# Patient Record
Sex: Female | Born: 1970 | Race: Black or African American | Hispanic: No | Marital: Married | Smoking: Never smoker
Health system: Southern US, Community
[De-identification: ages and names within clinical notes are randomized; demographics above are authoritative.]

## PROBLEM LIST (undated history)

## (undated) DIAGNOSIS — H3500 Unspecified background retinopathy: Secondary | ICD-10-CM

## (undated) DIAGNOSIS — N289 Disorder of kidney and ureter, unspecified: Secondary | ICD-10-CM

## (undated) DIAGNOSIS — F419 Anxiety disorder, unspecified: Secondary | ICD-10-CM

## (undated) DIAGNOSIS — I10 Essential (primary) hypertension: Secondary | ICD-10-CM

## (undated) DIAGNOSIS — G459 Transient cerebral ischemic attack, unspecified: Secondary | ICD-10-CM

## (undated) DIAGNOSIS — I469 Cardiac arrest, cause unspecified: Secondary | ICD-10-CM

## (undated) DIAGNOSIS — E119 Type 2 diabetes mellitus without complications: Secondary | ICD-10-CM

## (undated) HISTORY — PX: OTHER SURGICAL HISTORY: SHX169

## (undated) HISTORY — PX: AV FISTULA INSERTION W/ RF MAGNETIC GUIDANCE: CATH118308

## (undated) HISTORY — PX: ABDOMINAL HYSTERECTOMY: SHX81

---

## 1997-10-04 ENCOUNTER — Ambulatory Visit (HOSPITAL_COMMUNITY): Admission: RE | Admit: 1997-10-04 | Discharge: 1997-10-04 | Payer: Self-pay | Admitting: Specialist

## 1997-10-14 ENCOUNTER — Ambulatory Visit (HOSPITAL_BASED_OUTPATIENT_CLINIC_OR_DEPARTMENT_OTHER): Admission: RE | Admit: 1997-10-14 | Discharge: 1997-10-14 | Payer: Self-pay | Admitting: Specialist

## 1997-11-11 ENCOUNTER — Ambulatory Visit (HOSPITAL_COMMUNITY): Admission: RE | Admit: 1997-11-11 | Discharge: 1997-11-11 | Payer: Self-pay | Admitting: Specialist

## 1998-01-23 ENCOUNTER — Emergency Department (HOSPITAL_COMMUNITY): Admission: EM | Admit: 1998-01-23 | Discharge: 1998-01-23 | Payer: Self-pay | Admitting: Emergency Medicine

## 1998-01-27 ENCOUNTER — Other Ambulatory Visit: Admission: RE | Admit: 1998-01-27 | Discharge: 1998-01-27 | Payer: Self-pay | Admitting: Obstetrics and Gynecology

## 1998-02-27 ENCOUNTER — Inpatient Hospital Stay (HOSPITAL_COMMUNITY): Admission: AD | Admit: 1998-02-27 | Discharge: 1998-02-27 | Payer: Self-pay | Admitting: Obstetrics & Gynecology

## 1998-04-06 ENCOUNTER — Inpatient Hospital Stay (HOSPITAL_COMMUNITY): Admission: AD | Admit: 1998-04-06 | Discharge: 1998-04-06 | Payer: Self-pay | Admitting: Obstetrics and Gynecology

## 1998-05-05 ENCOUNTER — Inpatient Hospital Stay (HOSPITAL_COMMUNITY): Admission: AD | Admit: 1998-05-05 | Discharge: 1998-05-05 | Payer: Self-pay | Admitting: Obstetrics and Gynecology

## 1998-05-05 ENCOUNTER — Encounter: Payer: Self-pay | Admitting: Obstetrics and Gynecology

## 1998-05-07 ENCOUNTER — Encounter: Admission: RE | Admit: 1998-05-07 | Discharge: 1998-08-05 | Payer: Self-pay | Admitting: Obstetrics and Gynecology

## 1998-05-07 ENCOUNTER — Inpatient Hospital Stay (HOSPITAL_COMMUNITY): Admission: AD | Admit: 1998-05-07 | Discharge: 1998-05-16 | Payer: Self-pay | Admitting: Obstetrics and Gynecology

## 1998-06-21 ENCOUNTER — Observation Stay (HOSPITAL_COMMUNITY): Admission: AD | Admit: 1998-06-21 | Discharge: 1998-06-22 | Payer: Self-pay | Admitting: Obstetrics and Gynecology

## 1998-06-23 ENCOUNTER — Inpatient Hospital Stay (HOSPITAL_COMMUNITY): Admission: AD | Admit: 1998-06-23 | Discharge: 1998-06-23 | Payer: Self-pay | Admitting: Obstetrics and Gynecology

## 1998-07-21 ENCOUNTER — Ambulatory Visit (HOSPITAL_COMMUNITY): Admission: RE | Admit: 1998-07-21 | Discharge: 1998-07-21 | Payer: Self-pay | Admitting: Obstetrics and Gynecology

## 1998-07-26 ENCOUNTER — Inpatient Hospital Stay (HOSPITAL_COMMUNITY): Admission: AD | Admit: 1998-07-26 | Discharge: 1998-07-28 | Payer: Self-pay | Admitting: Obstetrics and Gynecology

## 1998-08-28 ENCOUNTER — Other Ambulatory Visit: Admission: RE | Admit: 1998-08-28 | Discharge: 1998-08-28 | Payer: Self-pay | Admitting: Obstetrics and Gynecology

## 1999-06-18 ENCOUNTER — Ambulatory Visit (HOSPITAL_COMMUNITY): Admission: RE | Admit: 1999-06-18 | Discharge: 1999-06-18 | Payer: Self-pay | Admitting: Obstetrics and Gynecology

## 1999-06-18 ENCOUNTER — Encounter (INDEPENDENT_AMBULATORY_CARE_PROVIDER_SITE_OTHER): Payer: Self-pay | Admitting: Specialist

## 1999-07-17 ENCOUNTER — Emergency Department (HOSPITAL_COMMUNITY): Admission: EM | Admit: 1999-07-17 | Discharge: 1999-07-17 | Payer: Self-pay | Admitting: Emergency Medicine

## 1999-07-17 ENCOUNTER — Encounter: Payer: Self-pay | Admitting: Emergency Medicine

## 1999-08-20 ENCOUNTER — Encounter: Payer: Self-pay | Admitting: Emergency Medicine

## 1999-08-20 ENCOUNTER — Emergency Department (HOSPITAL_COMMUNITY): Admission: EM | Admit: 1999-08-20 | Discharge: 1999-08-20 | Payer: Self-pay | Admitting: Emergency Medicine

## 1999-09-29 ENCOUNTER — Other Ambulatory Visit: Admission: RE | Admit: 1999-09-29 | Discharge: 1999-09-29 | Payer: Self-pay | Admitting: Obstetrics and Gynecology

## 2000-01-12 ENCOUNTER — Emergency Department (HOSPITAL_COMMUNITY): Admission: EM | Admit: 2000-01-12 | Discharge: 2000-01-12 | Payer: Self-pay | Admitting: Emergency Medicine

## 2000-08-03 ENCOUNTER — Encounter: Admission: RE | Admit: 2000-08-03 | Discharge: 2000-11-01 | Payer: Self-pay | Admitting: Gynecology

## 2000-12-23 ENCOUNTER — Inpatient Hospital Stay (HOSPITAL_COMMUNITY): Admission: AD | Admit: 2000-12-23 | Discharge: 2000-12-23 | Payer: Self-pay | Admitting: *Deleted

## 2001-01-11 ENCOUNTER — Observation Stay (HOSPITAL_COMMUNITY): Admission: AD | Admit: 2001-01-11 | Discharge: 2001-01-12 | Payer: Self-pay | Admitting: Gynecology

## 2001-01-20 ENCOUNTER — Inpatient Hospital Stay (HOSPITAL_COMMUNITY): Admission: AD | Admit: 2001-01-20 | Discharge: 2001-01-22 | Payer: Self-pay | Admitting: Gynecology

## 2001-01-21 ENCOUNTER — Encounter: Payer: Self-pay | Admitting: Gynecology

## 2001-02-08 ENCOUNTER — Inpatient Hospital Stay (HOSPITAL_COMMUNITY): Admission: AD | Admit: 2001-02-08 | Discharge: 2001-02-13 | Payer: Self-pay | Admitting: Gynecology

## 2001-02-08 ENCOUNTER — Encounter: Payer: Self-pay | Admitting: *Deleted

## 2001-02-08 ENCOUNTER — Inpatient Hospital Stay (HOSPITAL_COMMUNITY): Admission: RE | Admit: 2001-02-08 | Discharge: 2001-02-08 | Payer: Self-pay | Admitting: *Deleted

## 2001-02-08 ENCOUNTER — Encounter (INDEPENDENT_AMBULATORY_CARE_PROVIDER_SITE_OTHER): Payer: Self-pay | Admitting: Specialist

## 2001-02-10 ENCOUNTER — Encounter: Payer: Self-pay | Admitting: Obstetrics and Gynecology

## 2001-03-13 ENCOUNTER — Other Ambulatory Visit: Admission: RE | Admit: 2001-03-13 | Discharge: 2001-03-13 | Payer: Self-pay | Admitting: Gynecology

## 2002-12-30 ENCOUNTER — Encounter: Payer: Self-pay | Admitting: Emergency Medicine

## 2002-12-30 ENCOUNTER — Emergency Department (HOSPITAL_COMMUNITY): Admission: EM | Admit: 2002-12-30 | Discharge: 2002-12-30 | Payer: Self-pay | Admitting: Emergency Medicine

## 2003-02-19 ENCOUNTER — Emergency Department (HOSPITAL_COMMUNITY): Admission: EM | Admit: 2003-02-19 | Discharge: 2003-02-19 | Payer: Self-pay | Admitting: Emergency Medicine

## 2003-08-08 ENCOUNTER — Emergency Department (HOSPITAL_COMMUNITY): Admission: EM | Admit: 2003-08-08 | Discharge: 2003-08-08 | Payer: Self-pay | Admitting: Emergency Medicine

## 2003-08-16 ENCOUNTER — Emergency Department (HOSPITAL_COMMUNITY): Admission: EM | Admit: 2003-08-16 | Discharge: 2003-08-16 | Payer: Self-pay | Admitting: Emergency Medicine

## 2003-09-26 ENCOUNTER — Encounter: Admission: RE | Admit: 2003-09-26 | Discharge: 2003-09-26 | Payer: Self-pay | Admitting: Internal Medicine

## 2003-10-14 ENCOUNTER — Other Ambulatory Visit: Admission: RE | Admit: 2003-10-14 | Discharge: 2003-10-14 | Payer: Self-pay | Admitting: Obstetrics and Gynecology

## 2003-10-21 ENCOUNTER — Emergency Department (HOSPITAL_COMMUNITY): Admission: EM | Admit: 2003-10-21 | Discharge: 2003-10-21 | Payer: Self-pay | Admitting: Emergency Medicine

## 2003-11-18 ENCOUNTER — Encounter: Admission: RE | Admit: 2003-11-18 | Discharge: 2004-02-16 | Payer: Self-pay | Admitting: Endocrinology

## 2004-01-28 ENCOUNTER — Encounter (INDEPENDENT_AMBULATORY_CARE_PROVIDER_SITE_OTHER): Payer: Self-pay | Admitting: *Deleted

## 2004-01-28 ENCOUNTER — Inpatient Hospital Stay (HOSPITAL_COMMUNITY): Admission: RE | Admit: 2004-01-28 | Discharge: 2004-01-31 | Payer: Self-pay | Admitting: Obstetrics and Gynecology

## 2004-03-09 ENCOUNTER — Encounter: Admission: RE | Admit: 2004-03-09 | Discharge: 2004-03-09 | Payer: Self-pay | Admitting: Obstetrics and Gynecology

## 2004-05-06 ENCOUNTER — Emergency Department (HOSPITAL_COMMUNITY): Admission: EM | Admit: 2004-05-06 | Discharge: 2004-05-06 | Payer: Self-pay | Admitting: Emergency Medicine

## 2005-02-06 ENCOUNTER — Inpatient Hospital Stay (HOSPITAL_COMMUNITY): Admission: EM | Admit: 2005-02-06 | Discharge: 2005-02-08 | Payer: Self-pay | Admitting: Emergency Medicine

## 2005-03-18 ENCOUNTER — Ambulatory Visit: Payer: Self-pay | Admitting: Endocrinology

## 2005-03-25 ENCOUNTER — Ambulatory Visit: Payer: Self-pay | Admitting: Endocrinology

## 2005-04-01 ENCOUNTER — Ambulatory Visit: Payer: Self-pay | Admitting: Endocrinology

## 2005-04-16 ENCOUNTER — Ambulatory Visit: Payer: Self-pay | Admitting: Endocrinology

## 2005-05-07 ENCOUNTER — Ambulatory Visit: Payer: Self-pay | Admitting: Endocrinology

## 2005-05-15 IMAGING — CT CT ANGIO CHEST
1 of 3 series · 20 of 32 positions shown · IV contrast (omnipaque)
Comparison: None.

CLINICAL DATA: Chest pain, short of breath. 
CT ANGIOGRAPHY OF THE CHEST
TECHNIQUE: 150 cc of Omnipaque 300 IV.   PE protocol performed with multiplanar reconstruction.

[Series 4: chest/pe 1.0 b10f · axial · 0.74mm/px · z∈[-245,-35]mm · 20 of 464 slices shown]
[im 22/464  lung]
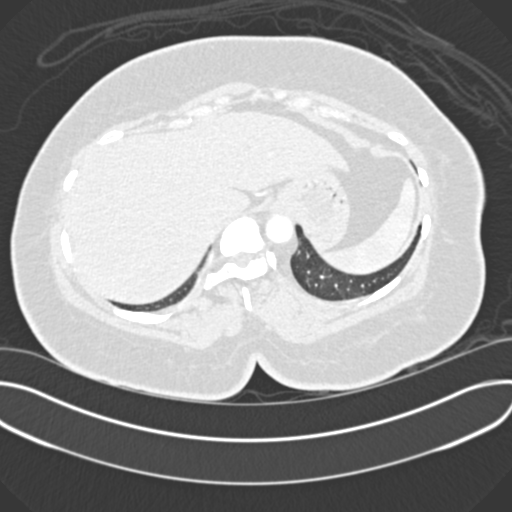
[im 43/464  mediastinal]
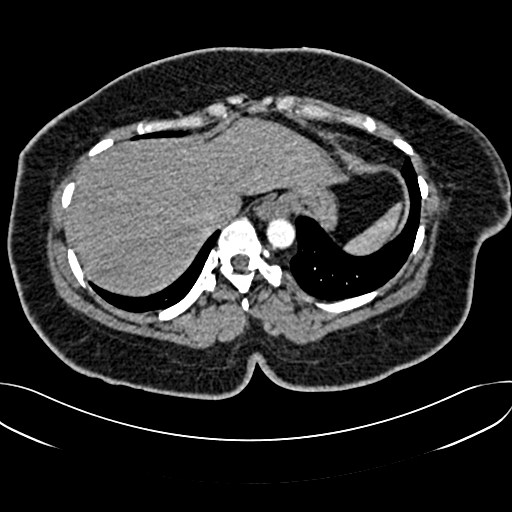
[im 85/464  lung]
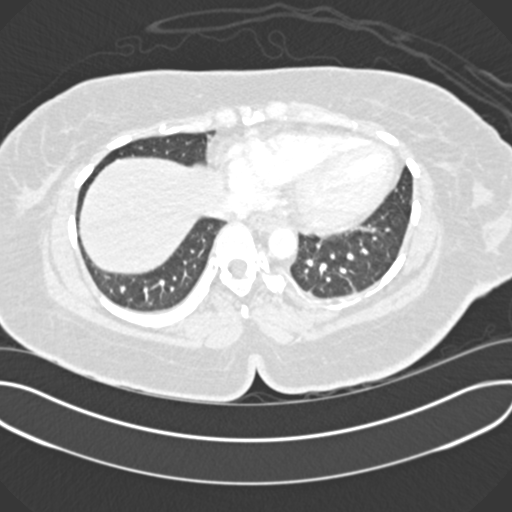
[im 106/464  mediastinal]
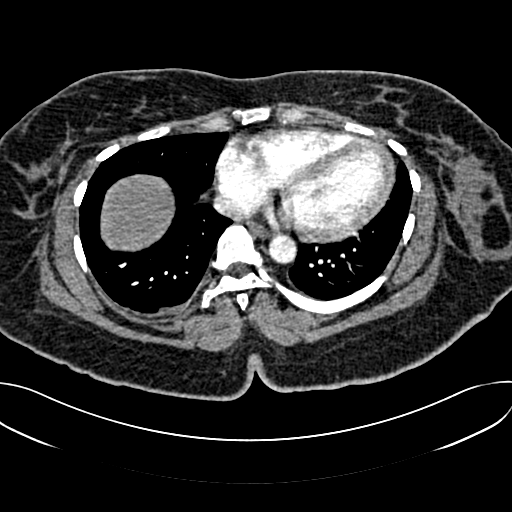
[im 115/464  lung]
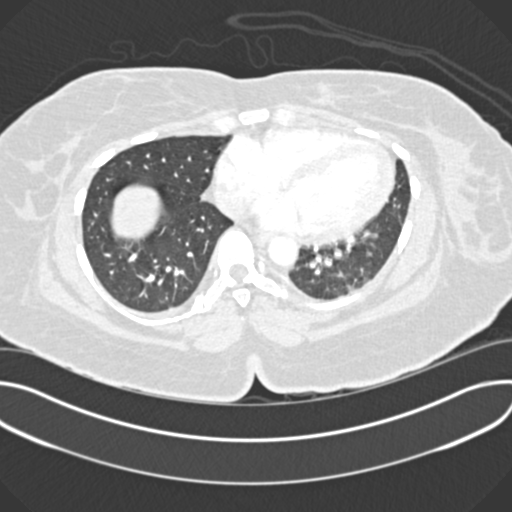
[im 127/464  mediastinal]
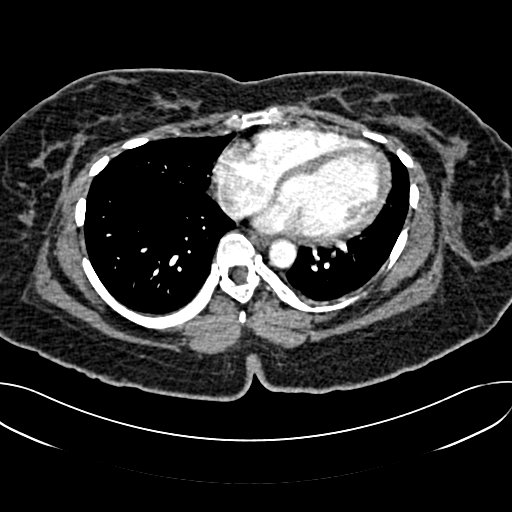
[im 148/464  lung]
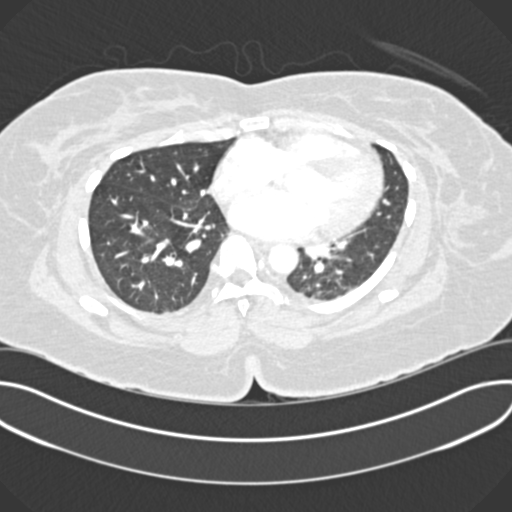
[im 169/464  mediastinal]
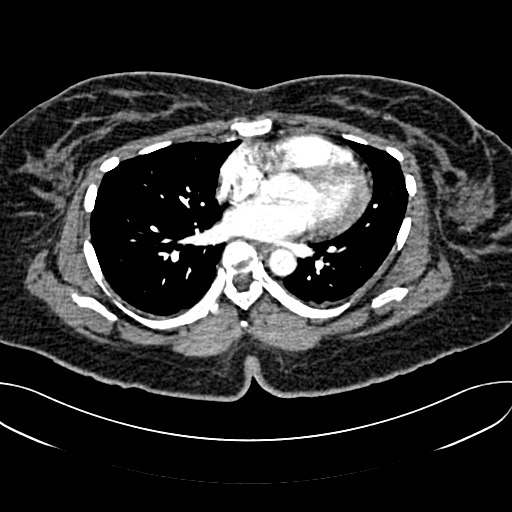
[im 190/464  lung]
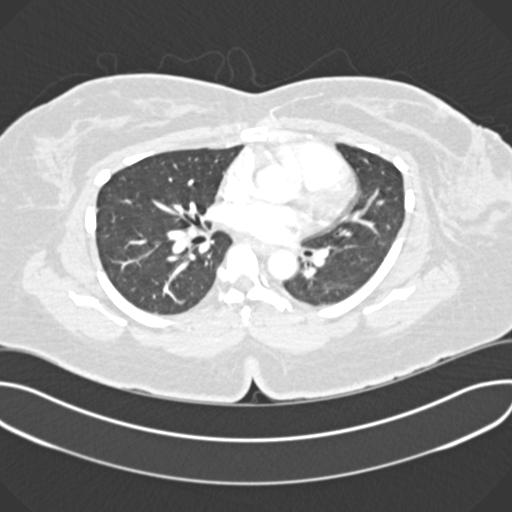
[im 211/464  mediastinal]
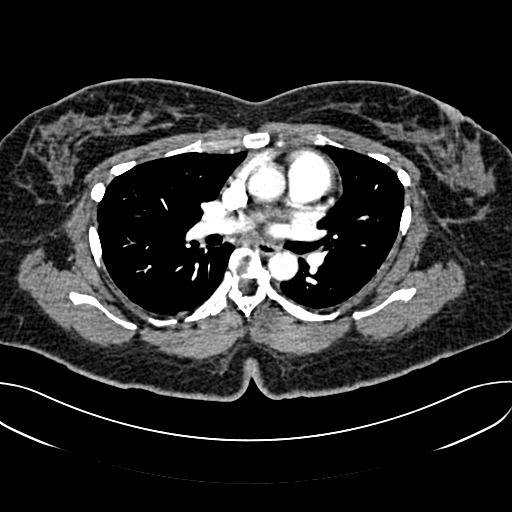
[im 232/464  lung]
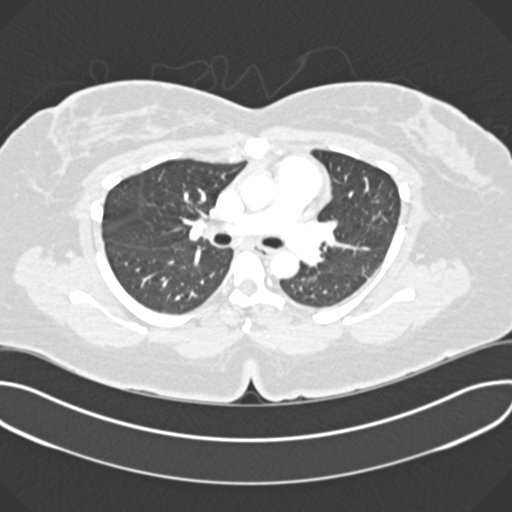
[im 253/464  mediastinal]
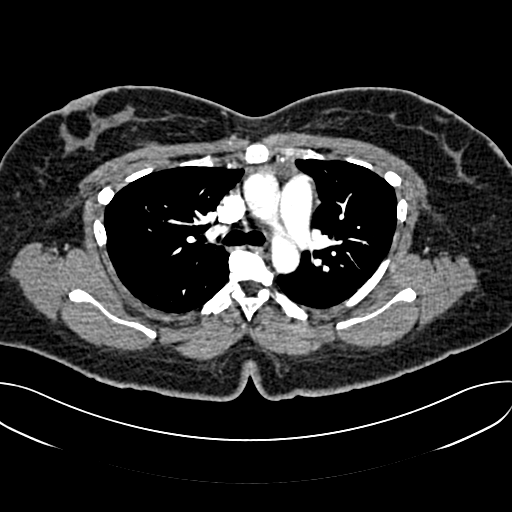
[im 295/464  lung]
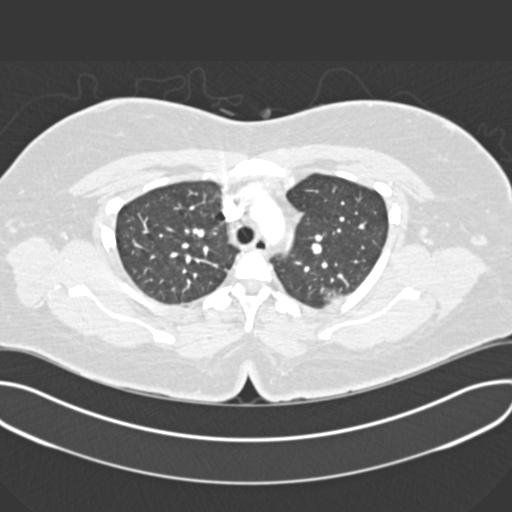
[im 309/464  mediastinal]
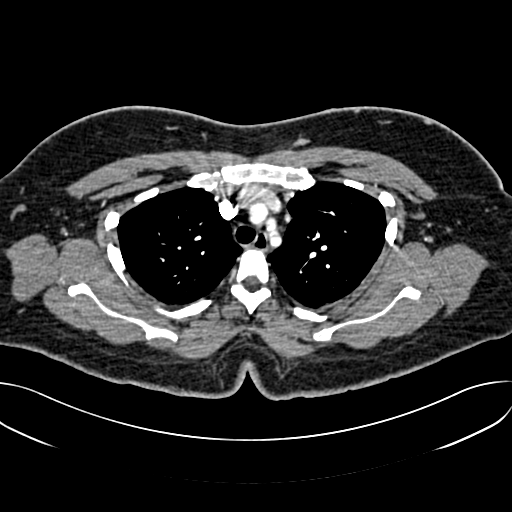
[im 316/464  lung]
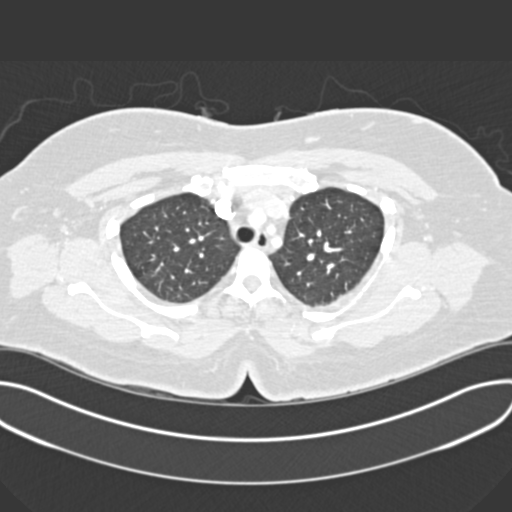
[im 337/464  mediastinal]
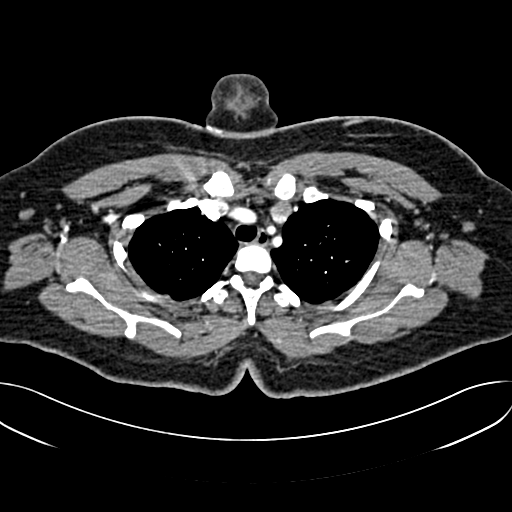
[im 358/464  lung]
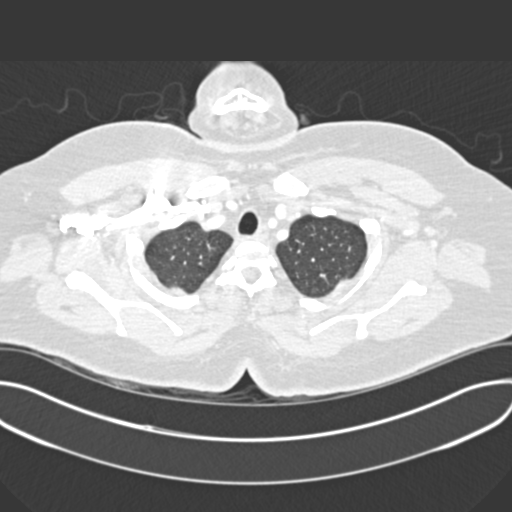
[im 400/464  mediastinal]
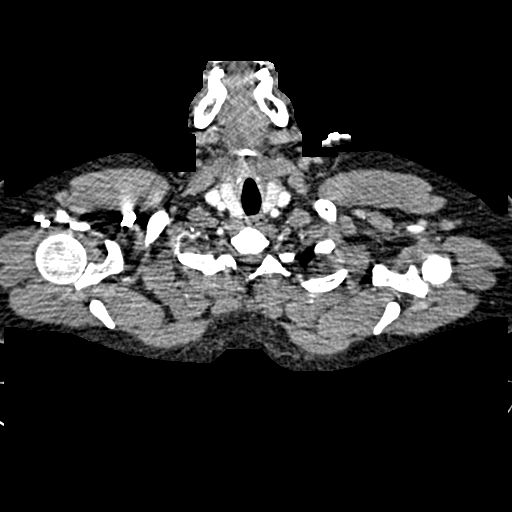
[im 421/464  lung]
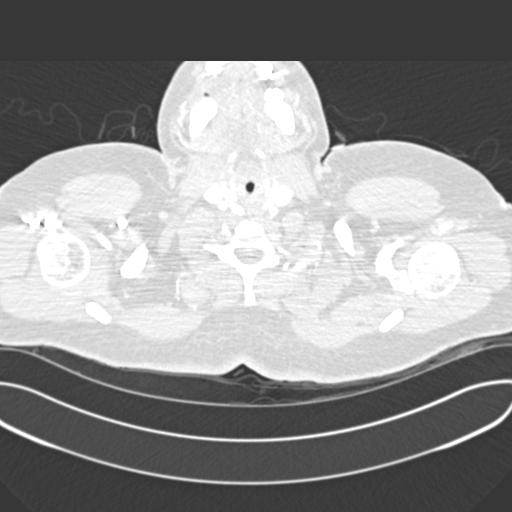
[im 442/464  mediastinal]
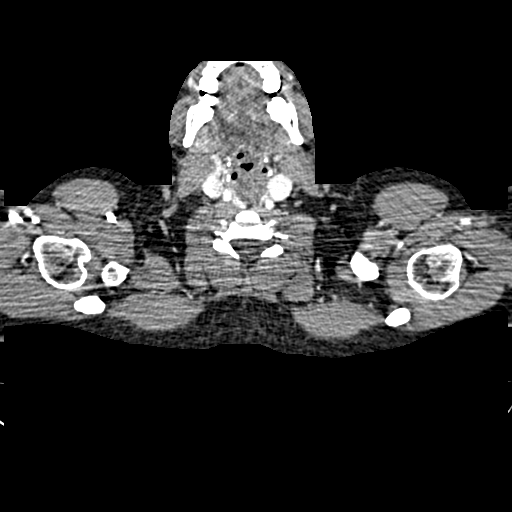

[20 of 32 positions shown; findings below may reference images not displayed]

Negative for pulmonary embolism.  The lungs are clear.  There is no infiltrate, effusion, or mass.  There is no adenopathy. 
IMPRESSION
Negative.

## 2005-06-04 ENCOUNTER — Ambulatory Visit: Payer: Self-pay | Admitting: Endocrinology

## 2005-06-09 ENCOUNTER — Ambulatory Visit: Payer: Self-pay | Admitting: Endocrinology

## 2005-06-17 ENCOUNTER — Ambulatory Visit: Payer: Self-pay | Admitting: Endocrinology

## 2005-07-01 ENCOUNTER — Ambulatory Visit: Payer: Self-pay | Admitting: Endocrinology

## 2005-08-16 ENCOUNTER — Ambulatory Visit: Payer: Self-pay | Admitting: Endocrinology

## 2005-09-03 ENCOUNTER — Ambulatory Visit: Payer: Self-pay | Admitting: Internal Medicine

## 2005-09-08 ENCOUNTER — Ambulatory Visit: Payer: Self-pay | Admitting: Endocrinology

## 2005-09-16 ENCOUNTER — Ambulatory Visit: Payer: Self-pay | Admitting: Endocrinology

## 2005-10-02 IMAGING — CT CT PELVIS W/O CM
1 series · 15 of 32 positions shown, 19 images · non-contrast
Comparison: None.

CT ABDOMEN AND PELVIS WITHOUT CONTRAST:

CLINICAL DATA: Left-sided flank pain, abdominal pain, question kidney stones.
TECHNIQUE: 5 mm contiguous axial images were obtained from the upper abdomen to the pubic
symphysis without oral or IV contrast.

[Series 2: renal stone · axial · 0.78mm/px · z∈[-339,-44]mm · 15 of 67 slices shown, 19 images]
[im 5/67  soft-tissue]
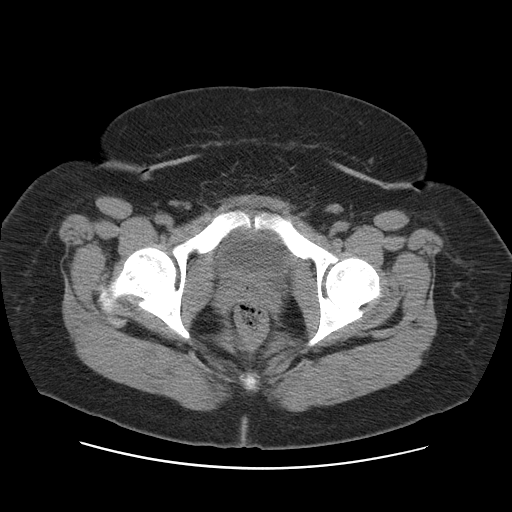
[im 5/67  bone]
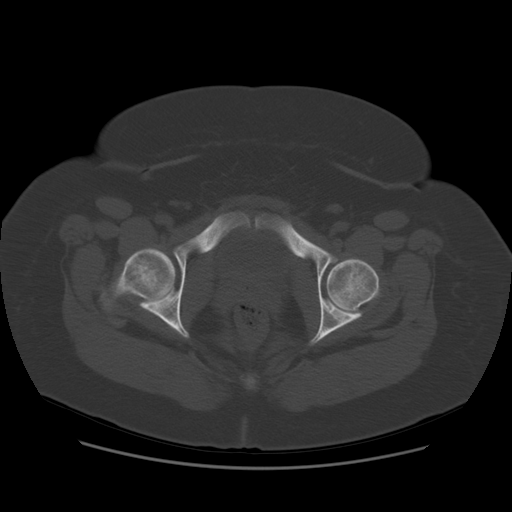
[im 9/67  soft-tissue]
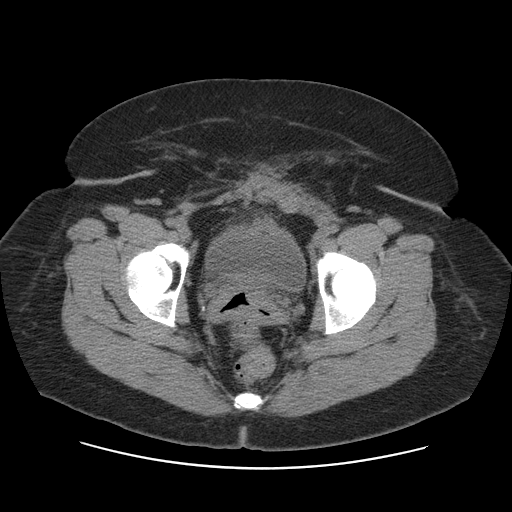
[im 13/67  soft-tissue]
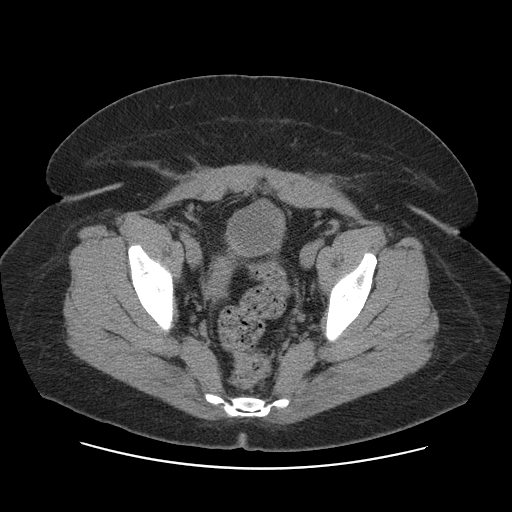
[im 20/67  soft-tissue]
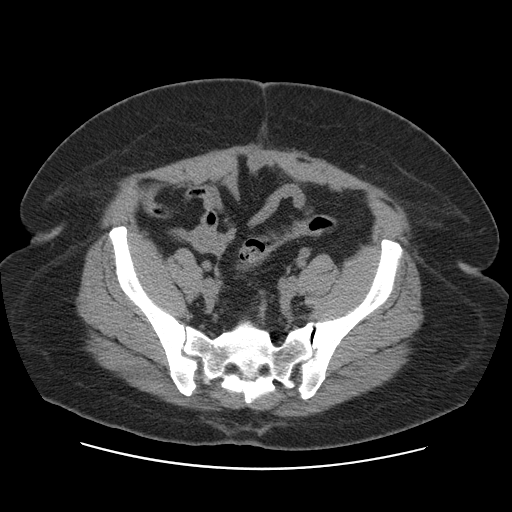
[im 24/67  soft-tissue]
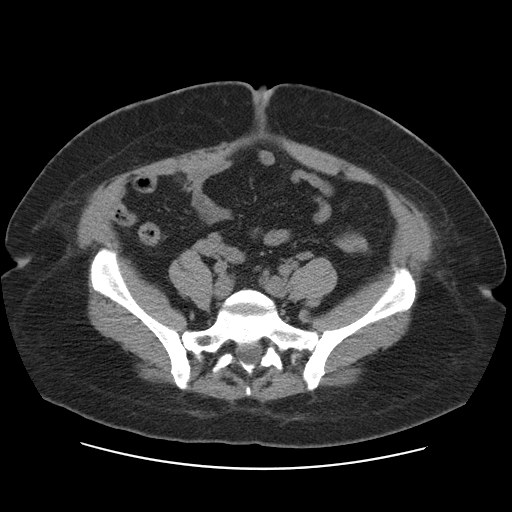
[im 28/67  soft-tissue]
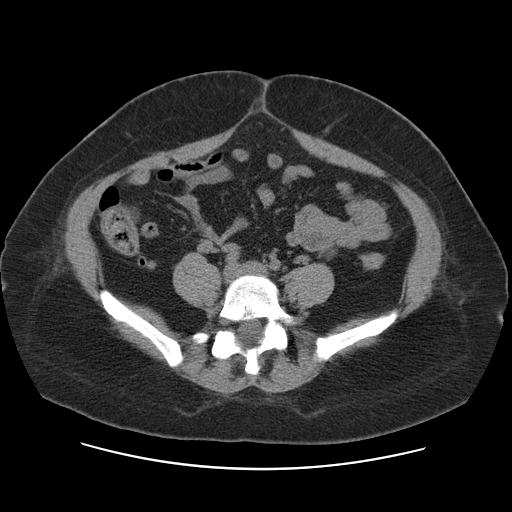
[im 35/67  soft-tissue]
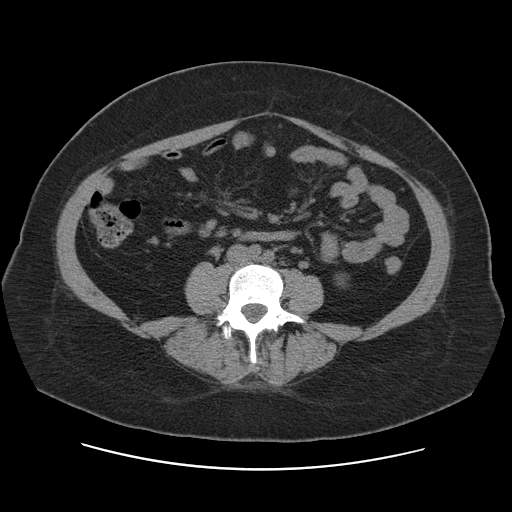
[im 39/67  soft-tissue]
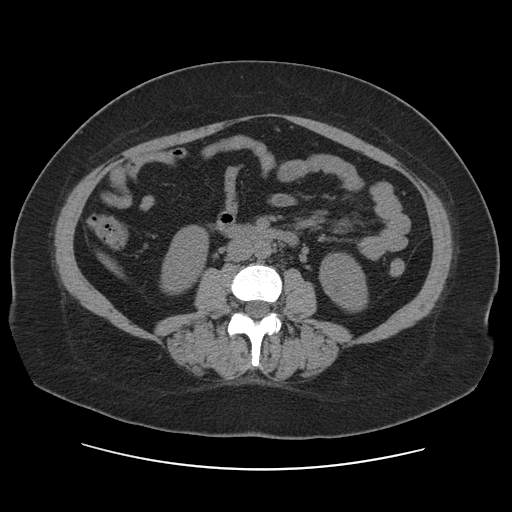
[im 43/67  soft-tissue]
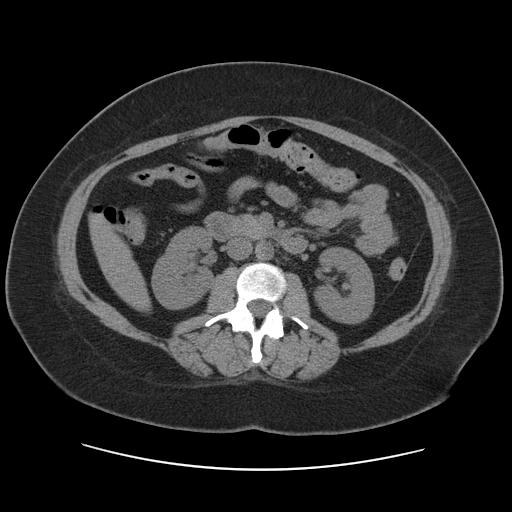
[im 43/67  bone]
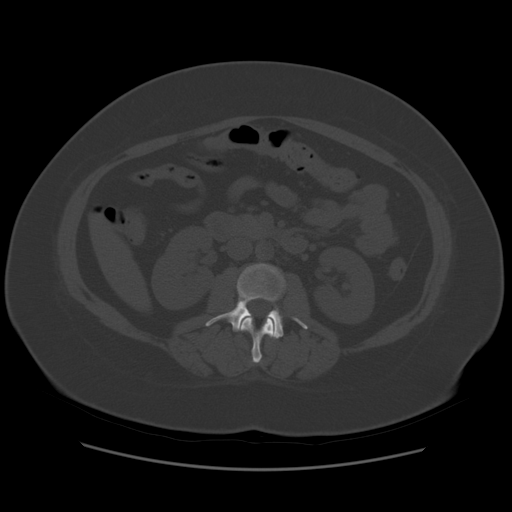
[im 47/67  soft-tissue]
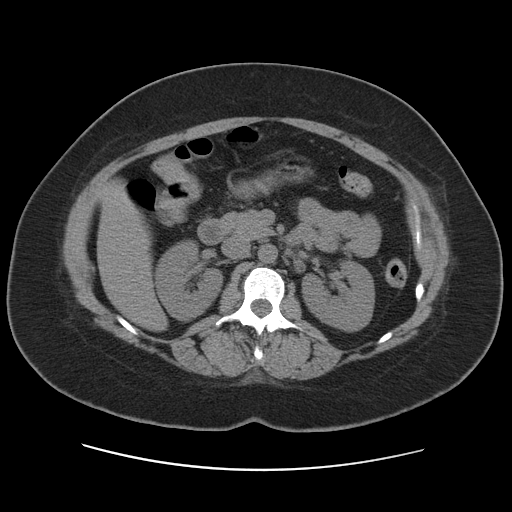
[im 54/67  soft-tissue]
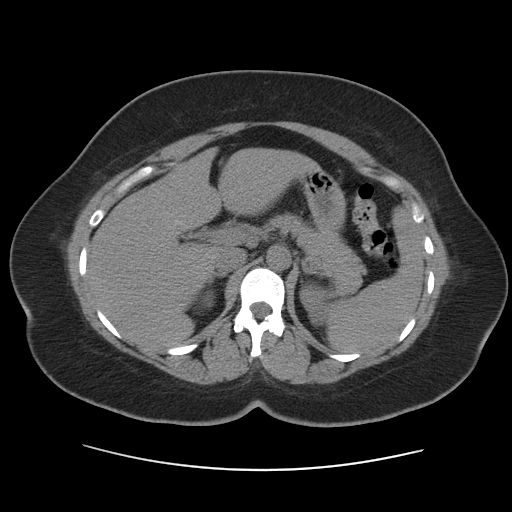
[im 58/67  soft-tissue]
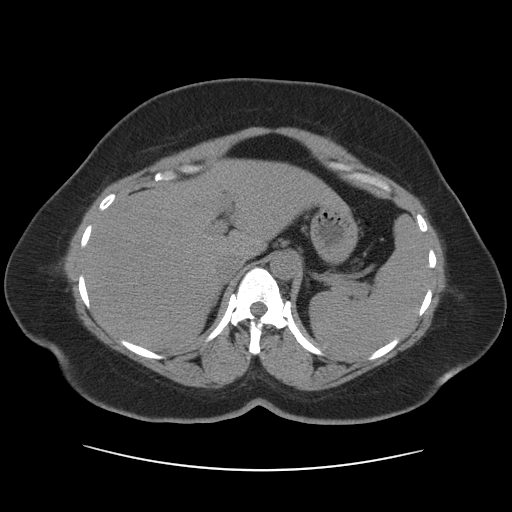
[im 58/67  lung]
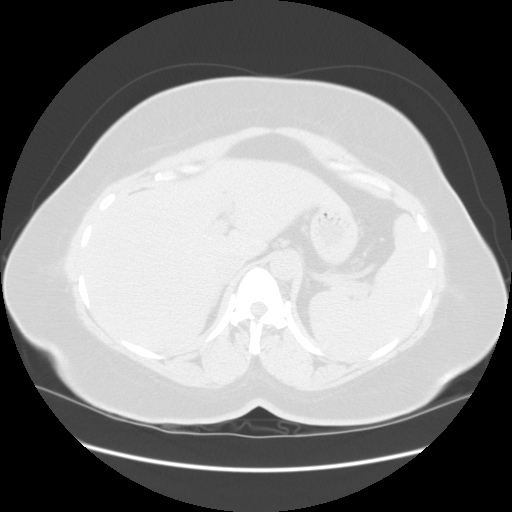
[im 60/67  lung]
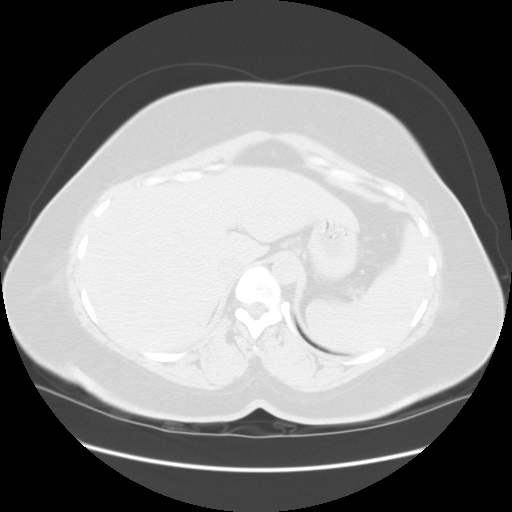
[im 62/67  soft-tissue]
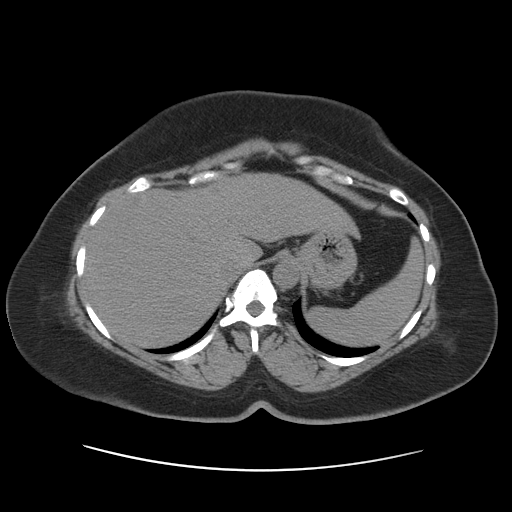
[im 62/67  lung]
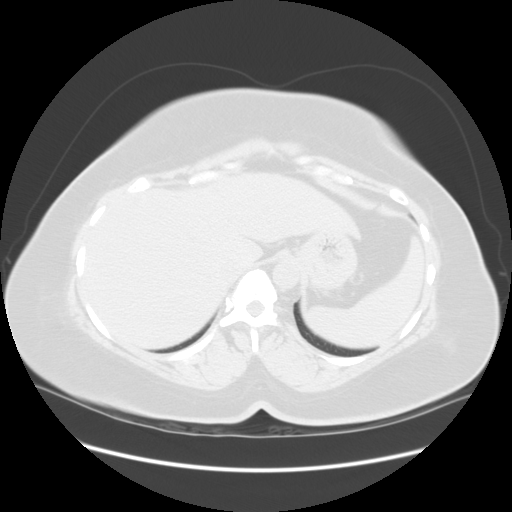
[im 64/67  lung]
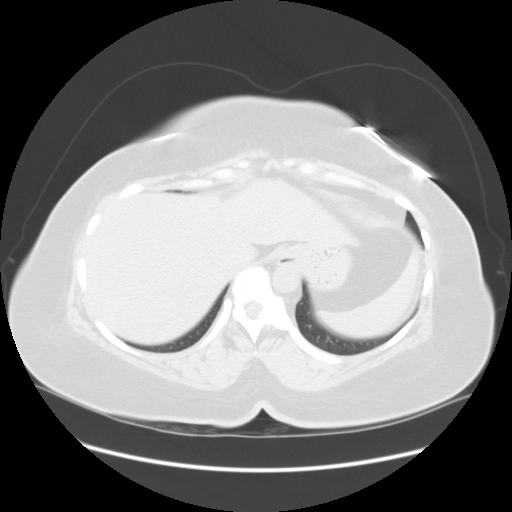

[15 of 32 positions shown; findings below may reference images not displayed]

ABDOMEN:
Imaged portions of the liver and spleen are unremarkable although neither organ has been imaged in
its entirety and the lack of intravenous contrast lessens sensitivity for evaluation. Visualized
stomach, duodenum, pancreas, gallbladder, adrenal glands, and abdominal bowel loops are
unremarkable. No free fluid or adenopathy.

There are no stones in either kidney. No hydronephrosis or secondary changes noted on either side.
IMPRESSION: Unremarkable uninfused CT scan of the abdomen. Specifically, there is no evidence for renal calculi
or secondary changes in either kidney.

PELVIS: 
Scanning through the anatomic pelvis reveals no stones in the distal ureters or within the urinary
bladder.

A 4.5 x 4.8 cm low-density focus is identified between the anterior dome of the bladder and the
umbilicus, at the midline, just deep to the rectus fascia. This is contiguous with the right ovary
and the left ovary cannot be identified. There is no associated ascites or lymphadenopathy.

The appendix and terminal ileum are normal. Patient is status post hysterectomy.
IMPRESSION: 1. No distal urinary stones. 
2. Approximately 5 cm cystic mass identified in the midline anterior anatomic pelvis. This is most
likely ovarian in etiology although a urachal cyst would be a consideration. Given the patient's age, benign etiology is favored, but pelvic ultrasound is recommneded to further characterize the relationship of the cystic mass to ovarian parenchyma.

## 2005-10-19 ENCOUNTER — Ambulatory Visit: Payer: Self-pay | Admitting: Endocrinology

## 2005-11-24 ENCOUNTER — Other Ambulatory Visit: Admission: RE | Admit: 2005-11-24 | Discharge: 2005-11-24 | Payer: Self-pay | Admitting: Endocrinology

## 2005-11-24 ENCOUNTER — Encounter: Payer: Self-pay | Admitting: Endocrinology

## 2005-11-24 ENCOUNTER — Ambulatory Visit: Payer: Self-pay | Admitting: Endocrinology

## 2005-12-07 ENCOUNTER — Ambulatory Visit: Payer: Self-pay

## 2006-02-02 ENCOUNTER — Ambulatory Visit: Payer: Self-pay | Admitting: Endocrinology

## 2006-03-25 ENCOUNTER — Ambulatory Visit: Payer: Self-pay | Admitting: Endocrinology

## 2006-03-25 ENCOUNTER — Inpatient Hospital Stay (HOSPITAL_COMMUNITY): Admission: EM | Admit: 2006-03-25 | Discharge: 2006-03-27 | Payer: Self-pay | Admitting: Endocrinology

## 2006-03-28 ENCOUNTER — Ambulatory Visit: Payer: Self-pay | Admitting: Endocrinology

## 2006-03-29 ENCOUNTER — Ambulatory Visit: Payer: Self-pay | Admitting: Endocrinology

## 2006-05-09 ENCOUNTER — Ambulatory Visit: Payer: Self-pay | Admitting: Endocrinology

## 2006-08-31 ENCOUNTER — Ambulatory Visit: Payer: Self-pay | Admitting: Endocrinology

## 2006-08-31 LAB — CONVERTED CEMR LAB: Hgb A1c MFr Bld: 14.5 % — ABNORMAL HIGH (ref 4.6–6.0)

## 2006-12-28 ENCOUNTER — Encounter: Payer: Self-pay | Admitting: Endocrinology

## 2006-12-28 DIAGNOSIS — I10 Essential (primary) hypertension: Secondary | ICD-10-CM | POA: Insufficient documentation

## 2006-12-28 DIAGNOSIS — K219 Gastro-esophageal reflux disease without esophagitis: Secondary | ICD-10-CM | POA: Insufficient documentation

## 2006-12-28 DIAGNOSIS — E109 Type 1 diabetes mellitus without complications: Secondary | ICD-10-CM | POA: Insufficient documentation

## 2006-12-28 DIAGNOSIS — F329 Major depressive disorder, single episode, unspecified: Secondary | ICD-10-CM | POA: Insufficient documentation

## 2007-01-16 ENCOUNTER — Ambulatory Visit: Payer: Self-pay | Admitting: Endocrinology

## 2007-01-16 LAB — CONVERTED CEMR LAB
ALT: 10 units/L (ref 0–35)
AST: 8 units/L (ref 0–37)
Albumin: 4 g/dL (ref 3.5–5.2)
Alkaline Phosphatase: 50 units/L (ref 39–117)
BUN: 8 mg/dL (ref 6–23)
Basophils Absolute: 0 10*3/uL (ref 0.0–0.1)
Basophils Relative: 0.4 % (ref 0.0–1.0)
Bilirubin Urine: NEGATIVE
Bilirubin, Direct: 0.1 mg/dL (ref 0.0–0.3)
CO2: 27 meq/L (ref 19–32)
Calcium: 8.9 mg/dL (ref 8.4–10.5)
Chloride: 101 meq/L (ref 96–112)
Cholesterol: 215 mg/dL (ref 0–200)
Creatinine, Ser: 0.6 mg/dL (ref 0.4–1.2)
Creatinine,U: 37.4 mg/dL
Direct LDL: 135.5 mg/dL
Eosinophils Absolute: 0.1 10*3/uL (ref 0.0–0.6)
Eosinophils Relative: 1.4 % (ref 0.0–5.0)
GFR calc Af Amer: 146 mL/min
GFR calc non Af Amer: 121 mL/min
Glucose, Bld: 314 mg/dL — ABNORMAL HIGH (ref 70–99)
HCT: 41.3 % (ref 36.0–46.0)
HDL: 64.6 mg/dL (ref >39.0)
Hemoglobin, Urine: NEGATIVE
Hemoglobin: 14.4 g/dL (ref 12.0–15.0)
Hgb A1c MFr Bld: 12.5 % — ABNORMAL HIGH (ref 4.6–6.0)
Ketones, ur: 15 mg/dL — AB
Leukocytes, UA: NEGATIVE
Lymphocytes Relative: 32.5 % (ref 12.0–46.0)
MCHC: 34.8 g/dL (ref 30.0–36.0)
MCV: 81.1 fL (ref 78.0–100.0)
Microalb Creat Ratio: 16 mg/g (ref 0.0–30.0)
Microalb, Ur: 0.6 mg/dL (ref 0.0–1.9)
Monocytes Absolute: 0.4 10*3/uL (ref 0.2–0.7)
Monocytes Relative: 6.7 % (ref 3.0–11.0)
Neutro Abs: 3.6 10*3/uL (ref 1.4–7.7)
Neutrophils Relative %: 59 % (ref 43.0–77.0)
Nitrite: NEGATIVE
Platelets: 200 10*3/uL (ref 150–400)
Potassium: 3.9 meq/L (ref 3.5–5.1)
RBC: 5.09 M/uL (ref 3.87–5.11)
RDW: 12.1 % (ref 11.5–14.6)
Sodium: 137 meq/L (ref 135–145)
Specific Gravity, Urine: 1.01 (ref 1.000–1.03)
TSH: 0.56 microintl units/mL (ref 0.35–5.50)
Total Bilirubin: 0.8 mg/dL (ref 0.3–1.2)
Total CHOL/HDL Ratio: 3.3
Total Protein, Urine: NEGATIVE mg/dL
Total Protein: 7.1 g/dL (ref 6.0–8.3)
Triglycerides: 50 mg/dL (ref 0–149)
Urine Glucose: >=1000 mg/dL — CR
Urobilinogen, UA: 0.2 (ref 0.0–1.0)
VLDL: 10 mg/dL (ref 0–40)
WBC: 6 10*3/uL (ref 4.5–10.5)
pH: 6 (ref 5.0–8.0)

## 2007-01-24 ENCOUNTER — Ambulatory Visit: Payer: Self-pay | Admitting: Cardiology

## 2007-02-01 ENCOUNTER — Ambulatory Visit: Payer: Self-pay

## 2008-03-25 IMAGING — CR DG CHEST 2V
2 series · 2 of 2 positions shown · non-contrast
Comparison: 03/25/2006

CLINICAL DATA: Cough

CHEST - 2 VIEW:

[view not recorded (1 of 2)]
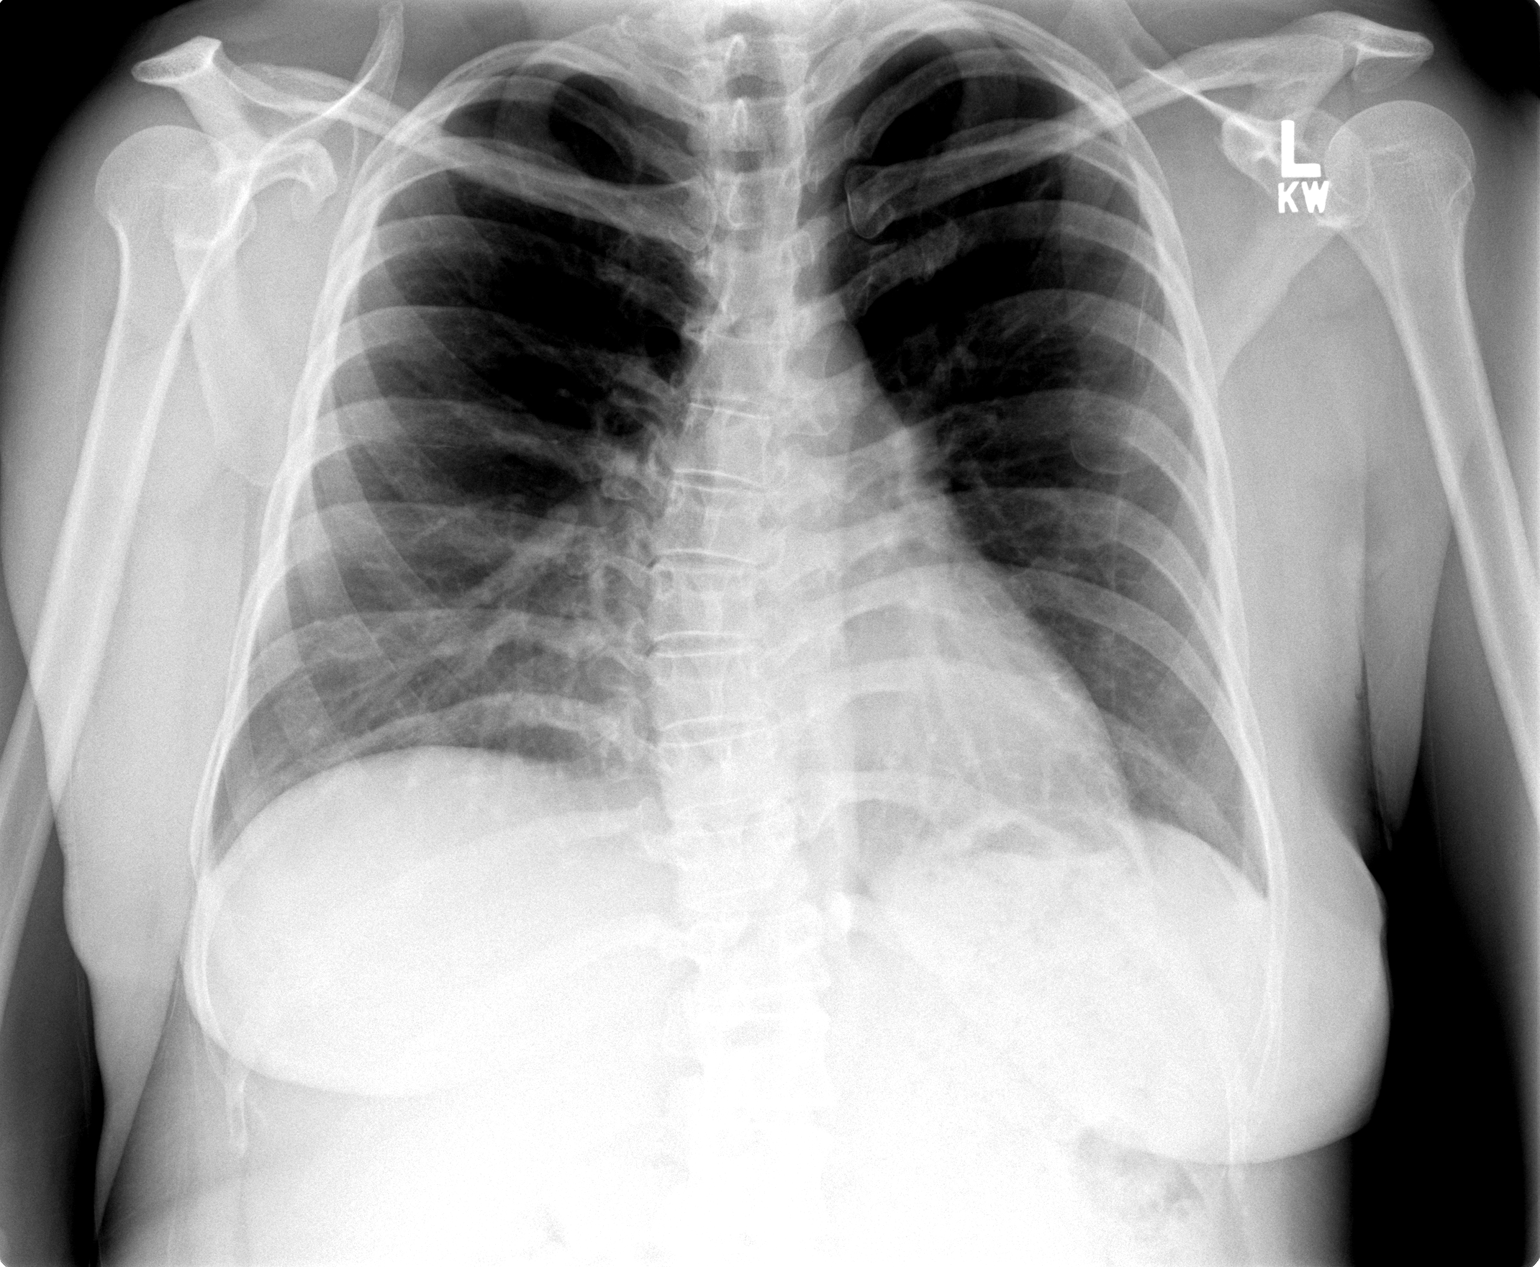

[view not recorded (2 of 2)]
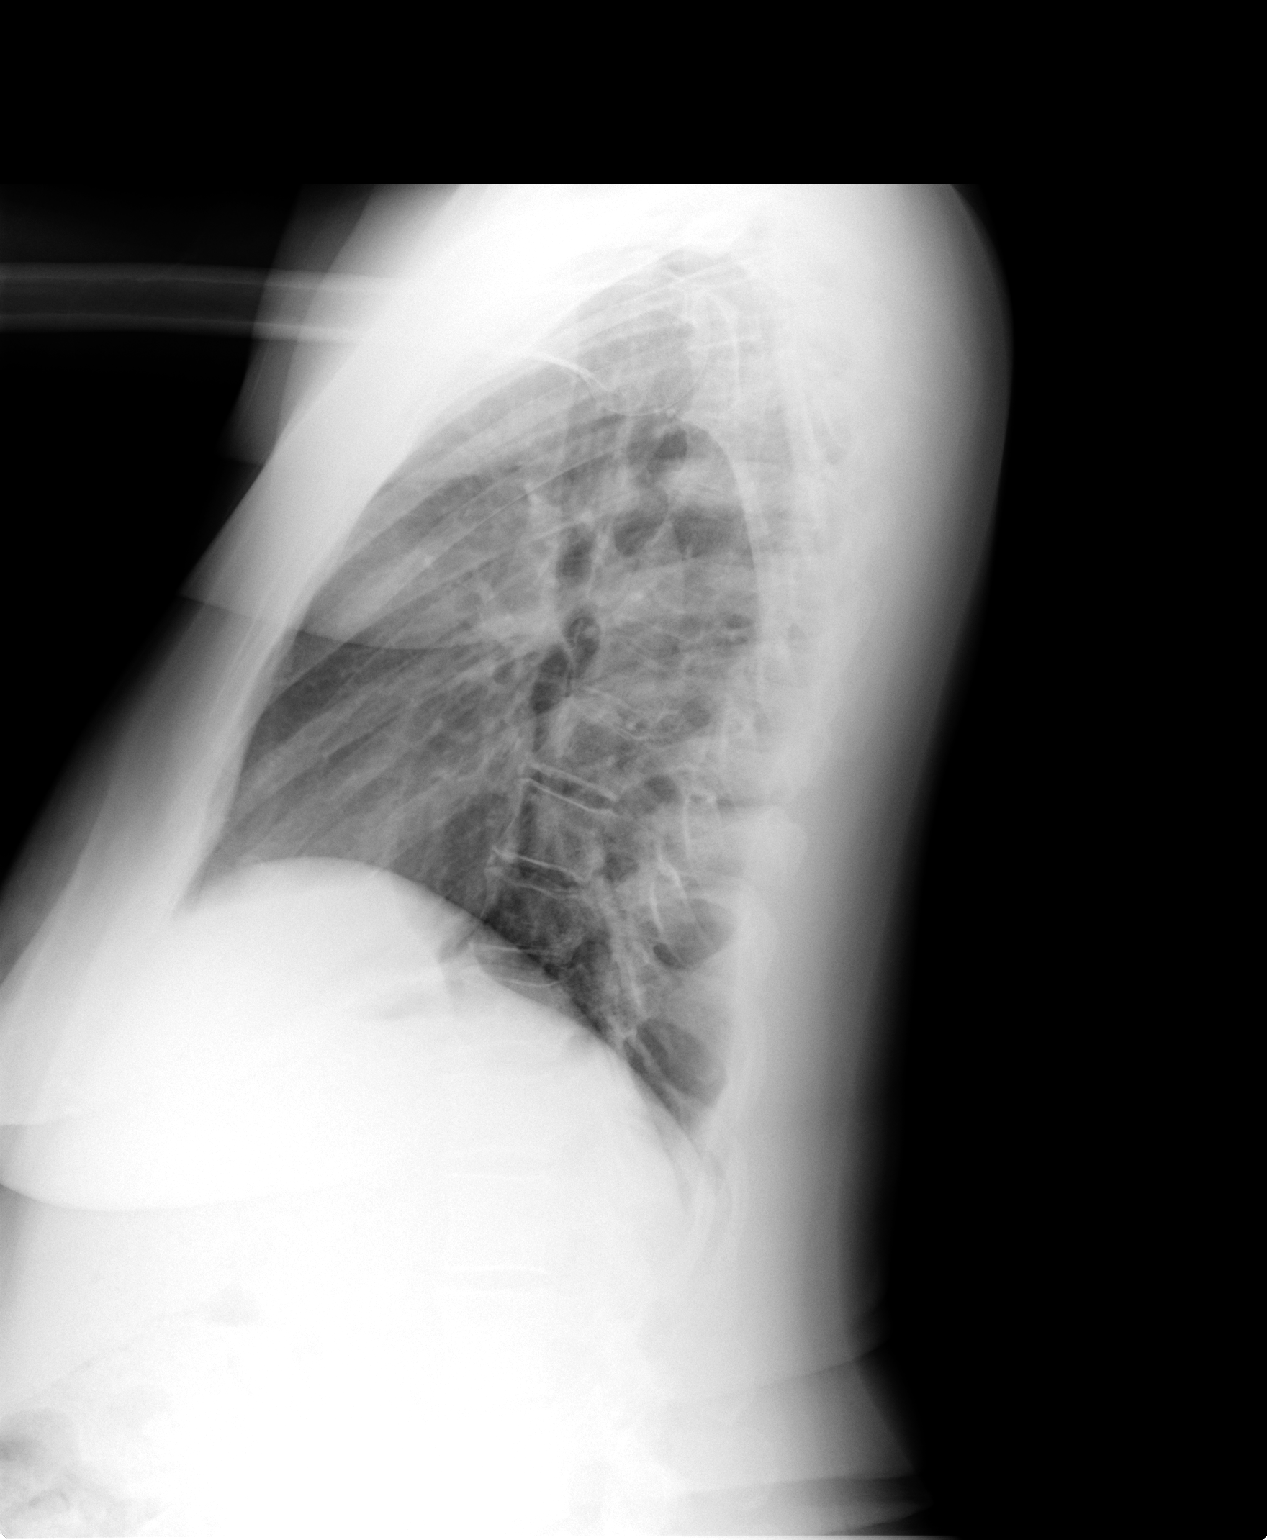

[2 of 2 positions shown; findings below may reference images not displayed]

FINDINGS: The heart size and mediastinal contours are within normal limits. 
Both lungs are clear.  Stable scoliosis of the thoracolumbar spine. .
IMPRESSION: No active cardiopulmonary disease

## 2010-06-28 ENCOUNTER — Encounter: Payer: Self-pay | Admitting: Endocrinology

## 2010-10-23 NOTE — H&P (Signed)
Rose Rodriguez, Rose Rodriguez                 ACCOUNT NO.:  [id number]   MEDICAL RECORD NO.:  [id number]          PATIENT TYPE:  INP   LOCATION:  6739                         FACILITY:  [facility]   PHYSICIAN:  Sean A. Everardo All, MD    DATE OF BIRTH:  01/08/1971   DATE OF ADMISSION:  03/25/2006  DATE OF DISCHARGE:                                HISTORY & PHYSICAL   REASON FOR ADMISSION:  Diabetes, hyperosmolar state.   HISTORY OF PRESENT ILLNESS:  A 40 year old woman with a 7-year history of  type I diabetes.  For some months now, she has had persistent glucoses over  300 despite over 400 units of insulin total per day.  Symptomatically, she  has excessive thirst, excessive urination, and associated moderate cramps of  all four extremities.   PAST MEDICAL HISTORY:  1. Hypertension.  2. Dyslipidemia.  3. GERD.  4. Depression.  5. Scoliosis.   SURGICAL HISTORY:  TAH 2005.  She did not have an oophorectomy.   MEDICATIONS:  Cymbalta 60 mg a day, Motrin 800 mg a day, Lyrica 60 mg a day,  Benicar 40 mg a day, Zetia 10 mg a day, Crestor 40 mg a day, Protonix 40 mg  a day, Lantus 200 units per day, Humalog 60 units with breakfast, 90 units  with lunch, and 130 units with supper.   SOCIAL HISTORY:  She is married and she is here with her two children in the  office today.  She is not a smoker.  Does not drink alcohol.   REVIEW OF SYSTEMS:  Denies the following:  Nausea, vomiting, hypoglycemia,  and shortness of breath.  She has lost 45 pounds over the past year.   PHYSICAL EXAMINATION:  VITAL SIGNS:  Blood pressure is 119/81, heart rate  108, temperature is 98.6, the weight is 126.  GENERAL:  No distress.  SKIN:  Not diaphoretic.  HEENT:  No proptosis.  No periorbital swelling.  PHARYNX:  No thrush.  Mucous membranes are moist.  NECK:  Supple, no goiter.  CHEST:  Clear to auscultation.  CARDIOVASCULAR:  There is no JVD, no edema.  Tachycardic, regular rhythm, no  murmur.  Pedal pulses are  intact.  ABDOMEN:  She has redundant skin.  Soft, nontender.  No hepatosplenomegaly.  No mass.  FEET:  Normal color and temperature.  There is no ulcer present on the feet.  NEUROLOGIC:  Alert and oriented.  Does not appear anxious nor depressed.  Cranial nerves appear to be intact and sensation is intact to touch in the  feet.   IMPRESSION:  1. Type I diabetes with hyperosmolar state.  She does not appear to have      ketoacidosis now.  2. Muscle cramps which she states are worse after her insulin injections,      uncertain etiology.  3. Other chronic medical problems as noted above.   PLAN:  1. Admit to [facility].  2. Manage glucose with Glucometer.  3. Symptomatic therapy for her cramps.  4. Check potassium and calcium levels.  5. Continue other outpatient medications.  ______________________________  Cleophas Dunker Everardo All, MD     SAE/MEDQ  D:  03/25/2006  T:  03/26/2006  Job:  [id number]   cc:   Gregary Signs A. Everardo All, MD

## 2010-10-23 NOTE — H&P (Signed)
Rose Rodriguez, Rose Rodriguez                 ACCOUNT NO.:  [id number]   MEDICAL RECORD NO.:  [id number]          PATIENT TYPE:  EMS   LOCATION:  ED                           [facility]AC[facility]  [facility]   PHYSICIAN:  Hollice Espy, M.D.DATE OF BIRTH:  Jul 19, 1970   DATE OF ADMISSION:  02/06/2005  DATE OF DISCHARGE:                                HISTORY & PHYSICAL   PRIMARY CARE PHYSICIAN:  Dorisann Frames, M.D., endocrinology.   CHIEF COMPLAINT:  Abdominal pain and not feeling well.   The patient is a 40 year old white female with a past medical history of  diabetes that has been difficult to control as well as hypertension,  hyperlipidemia, and depression.  She tells me that she has been followed by  Dr. Talmage Nap and has been on Lantus.  She has been diagnosed approximately six  years ago.  She has been on Lantus and Amaryl but her sugars have continued  to have problems with remaining very very high.  I do not know if she has  been noncompliant with her medications or if she remains with severe  problems.  Her other history, she tells me her blood pressure has also had  problems with highs and lows and that she has had a hysterectomy a little  more than a year or so ago and has had problems with normal bowel movements  since then and her bowels do not move regularly.  The patient says that for  the last few days, she has been feeling worse and worse, worsening abdominal  pain, nausea, vomiting, and finally could not take anymore and came into the  emergency room for further evaluation.  In the emergency room, when she  first arrived, her blood pressure, heart rate, and temperature were stable,  however, on lab work she was found to have a bicarb level of 14 and a  glucose of 536.  Her anion gap was 16.  It was felt that the patient was in  DKA from high blood sugars, and she was immediately started on IV fluids and  an insulin Glucomander.  Since that time, she says she still feels quite  rough.   REVIEW OF SYSTEMS:  She denies any headaches, or vision changes, or  dysphagia.  She denies any chest pain, palpitations, shortness of breath,  wheezing, or coughing.  She does complain of some generalized abdominal  discomfort.  She attempted to drink a little bit of water and immediately  threw it back up.  She denies any lower extremity pain, numbness, or  weakness, or fatigue, but she does complain again of chronic constipation.  She denies any diarrhea.  Review of systems is otherwise negative.   PAST MEDICAL HISTORY:  1.  Hypertension.  2.  Diabetes.  3.  Status post hysterectomy.  4.  Depression.  5.  Hyperlipidemia.  6.  She also has a reported history of asthma.   She denies any tobacco, alcohol, or drug use.   ALLERGIES:  ASPIRIN.   MEDICATIONS:  Amaryl, Lantus 80 q.h.s., hydrochlorothiazide, Avapro,  Lipitor, Cymbalta, and Bettia , as well  as an albuterol inhaler which she  has not been taking.   FAMILY HISTORY:  Diabetes, both in her mother and grandmother and sister.   SOCIAL HISTORY:  She denies tobacco, alcohol, or drug use.   PHYSICAL EXAMINATION:  VITAL SIGNS:  On admission, temp 97.5, heart rate 86,  blood pressure 127/92, respirations 16, O2 sat 100% on room air.  GENERAL:  The patient is alert and oriented x3.  She looks quite fatigued in  some distress secondary to her abdominal pain.  HEENT:  Normocephalic, atraumatic.  Her mucous membranes are quite dry.  She  has no carotid bruits.  HEART:  Regular rate and rhythm.  S1 S2.  LUNGS:  Clear to auscultation bilaterally.  ABDOMEN:  Soft, nondistended.  She has scant bowel sounds.  She has some  mild tenderness throughout.  EXTREMITIES:  Showed no clubbing, cyanosis, or edema.  Poor capillary  refill.   LAB WORK:  Her initial B-MET shows a sodium of 134, potassium 3.9, chloride  104, bicarb 14, BUN 5, creatinine 1.1, glucose 536, calcium 8.5.  Her anion  gap is 16.  Urinalysis shows greater than 1,000 of  glucose, 80 of ketones,  otherwise unremarkable.  Followup B-MET which approximately was done about  4.5 hours later, shows a anion gap now of 10 which is resolved, a glucose  down to 214, and her creatinine is down to 0.9.   X-RAYS:  I have put in for an abdominal portable two-view which is pending.   ASSESSMENT/PLAN:  1.  Diabetes mellitus with a mild diabetic ketoacidosis.  I suspect the      patient is difficult to say if she is a true type 1 or type 2, but she      did have an elevated anion gap with a mild acidosis.  This is very      easily and quickly remedied with intravenous fluids and an insulin drip.      The precipitating factor may possibly be medical noncompliance, although      the patient does tell me that she does take her Lantus 80 but still      remains high.  There may be other issues in effect such as a possible      ileus or a gastroparesis issue.  In regards to her diabetes mellitus, at      this point her acidosis has resolved.  We will plan to continue her      insulin sugars and once her sugars fall below 200, can switch her over      the subcutaneous insulin.  I, however, will keep her on an every six      hour sliding scale and not resume her Lantus until we have followed up      with her abdominal x-ray and rule that she is able to take full p.o. as      we do not want her to get hypoglycemic.  2.  Hypertension.  For now, we will hold her oral medications until we know      she is able to take p.o.  3.  Hyperlipidemia.  Same as her hypertension.  4.  Depression.  Again holding her oral medications.  5.  Abdominal pain.  I suspect the patient likely has gastroparesis.  She      may even had an ileus secondary to her dehydration.  We will check a      portable abdominal x-ray.  If there is no  evidence of an obstruction or      ileus, we will also go with intravenous Reglan to see if we can resolve     this issue.      Hollice Espy, M.D.   Electronically Signed    SKK/MEDQ  D:  02/06/2005  T:  02/06/2005  Job:  [id number]   cc:   Dorisann Frames, M.D.  [place] [address] [place] [address]  Fax: [phone]

## 2010-10-23 NOTE — Op Note (Signed)
NAME:  Rose Rodriguez, Rose Rodriguez                           ACCOUNT NO.:  [id number]   MEDICAL RECORD NO.:  [id number]                   PATIENT TYPE:  INP   LOCATION:  9308                                 FACILITY:  [facility]   PHYSICIAN:  Charles A. Sydnee Cabal, MD            DATE OF BIRTH:  1970/07/23   DATE OF PROCEDURE:  01/28/2004  DATE OF DISCHARGE:                                 OPERATIVE REPORT   PREOPERATIVE DIAGNOSES:  1. Pelvic pain.  2. Dysfunctional uterine bleeding.  3. Questionable uterine fibroids.  4. Probable endometriosis, recurrent.   POSTOPERATIVE DIAGNOSES:  1. Severe pelvic adhesive disease.  2. No active endometriosis found.   OPERATION PERFORMED:  1. Transabdominal hysterectomy.  2. Lysis of adhesions, moderate to severe in nature.   SURGEON:  Charles A. Sydnee Cabal, MD   ASSISTANT:  Rudy Jew. Ashley Royalty, M.D.   ANESTHESIA:  General.   COMPLICATIONS:  None.   ESTIMATED BLOOD LOSS:  300 mL.   SPECIMENS:  Uterus to pathology.   FLUID IN:  1500 mL.   URINE OUTPUT:  200 mL.   Sponge, needle and instrument counts were correct times two.   OPERATIVE FINDINGS:  Dense adhesions of the omentum to the left cornual  region of the uterus, anterior to the anterior abdominal wall, dense  adhesions of the left anterior uterus to the lateral anterior side wall and  to the anterior abdominal wall.  Clean ovaries, cul-de-sac and posterior cul-  de-sac and uterosacral ligaments were noted.  No evidence of active or  burned out endometriosis was found.  Adhesions were felt to be consistent  with prior surgeries as most likely cause.   DESCRIPTION OF PROCEDURE:  The patient was taken to the operating room and  placed in supine position.  General anesthetic was induced without  difficulty.  Sterile prep and drape was undertaken.  Pfannenstiel incision  was made with a knife, carried down to fascia.  Fascia was incised with the  knife and Mayo scissors.  Sharp dissection was used to  release the rectus  sheath superiorly and inferiorly.  The rectus muscle was sharply dissected  in the midline.  Peritoneum was entered carefully.  There was no damage to  bowel, bladder or vascular structures.  Balfour retractor was placed after  careful sharp dissection, transection and ligation.  Transfixion stitches  were used to take down the very dense adhesions of the uterine cornual  region adhesions.  Omental adhesions had to be taken down sharply as well.  Electrocautery was used carefully.  There was no damage to bowel and  hemostasis was adequate.  Extensive omental adhesions were taken down.  Ligation was not required.  After omental adhesions were taken down, the  uterus could be visualized adequately, cornual region, dense adhesions were  taken down as noted further.  This allowed the round ligament to be exposed  and the uterus to be mobilized back  down into the pelvis.  Balfour tractor  then as noted placed. Four moist laps were used to pack the bowel out of the  pelvis.  Cornual clamps were placed and the uterus was held.  The round  ligaments were transected on either side.  Broad ligaments were opened.  Infundibulopelvic pedicles were isolated.  Utero-ovarian pedicles were then  transected, free-tied and then transfixion stitch with good hemostasis  resulting.  Uterine vessels were isolated.  Bladder was taken down sharply  on the left.  This was taken down densely.  Adhesions extending down to the  bladder.  This was done carefully without damage to the bladder.  Some  electrocautery was used and some sharp dissection.  Hemostasis was adequate.  Bladder was taken down off the lower uterine segment adequately.  Uterine  vessels were skeletonized, double-stitched on either side and hemostasis was  adequate.  Remaining pedicles on either side were taken with good  hemostasis.  Transfixion stitches were applied.  Minor electrocautery was  used on the peritoneal edges that  were bleeding.  Hemostasis was adequate.  Final stitches on either side incorporating the uterosacral ligaments and  vaginal angle.  The cervix was amputated from the vagina. Vaginal cuff was  then closed with Richardson angle sutures on either side and then with  running locking 0 Vicryl.  Hemostasis was adequate.  Irrigation was carried  out.  All pedicles were visualized.  Cuff was visualized.  Hemostasis was  good.  Single figure-of-eight suture was used on the right side wall.  Good  hemostasis resulted.  Triple figure-of-eight was used on the left ovarian  pedicle near the ovary for minor bleeder.  Hemostasis was adequate.  Irrigation was carried out and all pedicles were revisualized.  Hemostasis  was adequate. All instruments and packs were removed.  Balfour retractor was  removed.  Subfascial hemostasis was visualized.  Minor electrocautery was  used.  Hemostasis was adequate.  Subfascial hemostasis was revisualized,  adequate.  Fascia was then closed with #1 Vicryl running, nonlocking suture.  Subcutaneous irrigation was carried out.  Hemostasis was adequate after  minor electrocautery.  Skin was closed with sterile skin clips.  The patient  tolerated the procedure well and was taken to recovery in good condition.                                               Charles A. Sydnee Cabal, MD    CAD/MEDQ  D:  01/28/2004  T:  01/28/2004  Job:  [id number]

## 2010-10-23 NOTE — H&P (Signed)
[facility]  Patient:    Rose Rodriguez, Rose Rodriguez                        MRN: [id number] Adm. Date:  [id number] Attending:  Merrily Pew                         History and Physical  CHIEF COMPLAINT:              Nausea, vomiting, abdominal pain.  HISTORY OF PRESENT ILLNESS:   A 40 year old, G2, P38, female at [redacted] weeks gestation with pregnancy complicated by gestational diabetes and chronic hypertension. The patient is on 24 units regular insulin, 30 units NPH insulin in the a.m. and 18 units of regular at dinner and 14 units of NPH at bedtime. The patient is also on Aldomet 250 mg t.i.d. The patient notes that starting this morning she has had vomiting and some pressure in the lower vaginal area with some cramping. She has no bleeding, rupture of membranes, or other symptoms.  PAST MEDICAL HISTORY:         Uncomplicated.  PAST SURGICAL HISTORY:        Breast reduction and laparoscopy for endometriosis.  ALLERGIES:                    None.  REVIEW OF SYSTEMS:            Noncontributory.  SOCIAL HISTORY:               Noncontributory.  FAMILY HISTORY:               Noncontributory.  ADMISSION PHYSICAL EXAMINATION:  VITAL SIGNS:                  Blood pressure 130/84, vital signs are stable.  HEENT:                        Normal.  LUNGS:                        Clear.  CARDIAC:                      Regular rate without rubs, murmurs, or gallops.  ABDOMEN:                      Gravid, consistent with dates, questionable presentation.  CERVIX:                       Fingertip, 50% effaced, high, out of pelvis per nursing.  ELECTRONIC FETAL MONITORING:  External monitors initially with contractions every two to three minutes following IV hydration, every six to seven minutes and irregular. The fetal tracing is reactive.  LABORATORY STUDIES:           Glucose of 156 with an otherwise normal comprehensive metabolic panel. CBC is normal  noting normal platelets and hemoglobin of 12.7. Urinalysis is negative with the exception of 15 mg/dl of ketones.  ASSESSMENT AND PLAN:          A 40 year old, G2, P2, female at 61 weeks with diabetes, fairly high insulin requirements, and chronic hypertension initially with cramping and contractions. Cervix was mildly changed although without evidence of overt labor. After IV hydration, contractions have spaced. Will plan on admission overnight with continued  IV hydration. Will cover glucoses with sliding scale. At present, continue her on her antihypertensives and monitor this evening. Assuming contractions resolve and she is without nausea or vomiting in the morning, then will plan on discharge home after recheck of her cervix. DD:  01/11/01 TD:  01/11/01 Job: [id number]

## 2010-10-23 NOTE — Discharge Summary (Signed)
[facility]  Patient:    Rose Rodriguez, Rose Rodriguez Visit Number: [id number] MRN: [id number]          Service Type: OBS Location: 910A 9110 01 Attending Physician:  Merrily Pew Dictated by:   Antony Contras, [facility] Admit Date:  02/08/2001 Discharge Date: 02/13/2001                             Discharge Summary  DISCHARGE DIAGNOSES:          1. Intrauterine pregnancy at 37 weeks.                               2. Class B diabetes.]                               3. Chronic hypertension.                               4. Severe variable decelerations.                               5. Arrest of dilatation.                               6. Multiparity, desiring sterility.  PROCEDURES:                   Low cervical transverse cesarean section with                               postpartum tubal sterilization with delivery                               of viable infant.  HISTORY OF PRESENT ILLNESS:   The patient is a 40 year old, gravida 2, para 0-1-0-1, with an LMP of May 23, 2000, [facility] March 05, 2001. Prenatal history was complicated by chronic hypertension for which patient was placed on Aldomet 250 mg t.i.d., obesity, class B diabetes requiring multiple daily doses of insulin, sickle cell trait.  PRENATAL LABORATORY DATA:     Blood type O positive, antibody screen negative, RPR, HBsAg, HIV nonreactive. GBS is negative.  HOSPITAL COURSE AND TREATMENT:                The patient was admitted for induction of labor on February 09, 2001 secondary to gestational diabetes. During the course of labor she did sustain severe variable decelerations and reached 6 cm dilatation. Since she did desire sterilization, she was taken to the OR where primary low transverse cesarean section and tubal sterilization was performed by Dr. Penni Homans under epidural anesthesia. She was delivered of an Apgar 2/9 female infant weighing 7 pounds 5 ounces with a cord pH of  7.34. The cord was also noted to have a nuchal cord wrapped around the fetal arm and face. There was normal appearing uterus, tubes, and ovaries.  POSTPARTUM COURSE:            The patient did remain afebrile and was able to be discharged in satisfactory condition on her third  postoperative day.  CBC:  Hematocrit 28.8, hemoglobin 9.9, platelets 170,000.  DISPOSITION:                  The patient is to follow up in the office in two weeks for blood pressure check, continue with prenatal vitamins and iron with Motrin and Tylox for pain. Dictated by:   Antony Contras, [facility] Attending Physician:  Merrily Pew DD:  03/10/01 TD:  03/12/01 Job: [id number]

## 2010-10-23 NOTE — Discharge Summary (Signed)
[facility]  Patient:    Rose Rodriguez, Rose Rodriguez Visit Number: [id number] MRN: [id number]          Service Type: OBS Location: [id number] Attending Physician:  Douglass Rivers Dictated by:   Antony Contras, N.P. Admit Date:  01/20/2001 Discharge Date: 01/22/2001                             Discharge Summary  DISCHARGE DIAGNOSES:          Intrauterine pregnancy at 39 4/7 weeks, gestational diabetes with poor glycemic control.  HISTORY OF PRESENT ILLNESS:   Patient is a 40 year old gravida 2, para 0-1-0-1 with an LMP of May 18, 2000, [facility] February 25, 2001.  Pregnancy was complicated by chronic hypertension, obesity, gestational diabetes, positive sickle cell trait.  PRENATAL LABORATORIES:        Blood type O+.  Antibody screen negative.  RPR, HBSAG, HIV nonreactive.  Rubella immune.  MSAFP normal.  HOSPITAL COURSE:              Patient was admitted on January 20, 2001 secondary to elevated fasting blood sugars over the past week.  Values ranging from 89-174.  Two hour values ranging from 83-261.  Patient had also been followed by Dr. Criss Alvine, endocrinologist, but had failed to follow-up and is currently requiring a large amount of insulin for management.  Management plan including checking fasting blood sugars, two hours p.c.s.  Patient was placed on a 2200 ADA countered diet.  A 24 hour urine for protein and creatinine was also obtained as well as amylase, lipase, CMP, and gallbladder ultrasound. Blood sugars improved with hospitalization.  Gallbladder ultrasound revealed no evidence of stones.  Patient was placed on sliding scale insulin and was able to be discharged on her second hospital day in satisfactory condition.  DISPOSITION:                  Patient is to continue with ADA diet, insulin, and blood sugar checks and will be followed weekly in the office. Dictated by:   Antony Contras, N.P. Attending Physician:  Douglass Rivers DD:   02/03/01 TD:  02/03/01 Job: [id number]

## 2010-10-23 NOTE — Op Note (Signed)
[facility]  Patient:    Rose Rodriguez                         MRN: [id number] Proc. Date: 06/18/99 Adm. Date:  [id number] Attending:  Cordelia Pen Ii                           Operative Report  PREOPERATIVE DIAGNOSIS:       Dysmenorrhea.  Menorrhagia.  POSTOPERATIVE DIAGNOSIS:      Endometriosis.  Uterine leiomyomata.  Uterine enlargement.  OPERATION:                    Laparoscopy with ablation and resection of endometriosis, ablation of uterine leiomyomata, and hysteroscopy with dilatation and curettage.  SURGEON:                      Guy Sandifer. Arleta Creek, M.D.  ASSISTANT:  ANESTHESIA:                   General with endotracheal intubation.  ESTIMATED BLOOD LOSS:         Drops.  SORBITOL:                     30 cc deficit.  INDICATIONS:                  This patient is a 40 year old married black female, gravida 1, para 1, with increasing dysmenorrhea and low back pain as well as progressively heavier menses.  After discussion of the options, laparoscopy, hysteroscopy, and D&C is discussed.  Possible risks and complications are discussed including, but not limited to infection, uterine perforation, organ damage including, but not limited to, bowel, bladder, or ureteral damage, bleeding requiring transfusion of blood products with possible transfusion reaction, HIV, and hepatitis acquisition, DVT, PE, and pneumonia.  All questions are answered nd consent is signed and on the chart.  FINDINGS:                     The uterine cavity is normal with no abnormal vessels or structures noted.  Abdominally, upper abdomen appears grossly normal.  The uterus is enlarged with question of a posterior fundal fibroid of approximately 3 cm that is intramural.  There is a 1.5 cm subserosal fibroid on the anterior fundus.  Anterior cul-de-sac is clean.  Posterior cul-de-sac contains a single ark implant of endometriosis in the center.  On  the right pelvic side wall there is a single dark implant of endometriosis noted.  The left pelvic appears normal. The tubes and ovaries are normal.  DESCRIPTION OF PROCEDURE:     The patient is taken to the operating room and placed in the dorsal supine position where general anesthesia is induced via endotracheal intubation.  She is then placed in the dorsal lithotomy position where she is prepped abdominally and vaginally.  The bladder is straight catheterized and she is draped in a sterile fashion.  The cervix is then grasped with a single tooth tenaculum and a paracervical block with 1% Xylocaine is placed at the 4, 5, 7, nd 8 oclock positions.  The cervix is then gently and progressively dilated to a 29 Pratt dilator.  Diagnostic hysteroscope is placed in the cervix and advanced under direct visualization using Sorbitol distending media.  Above findings are noted. The hysteroscope is removed  and sharp curettage is carried out.  Hulka tenaculum is used to replace the single tooth tenaculum.  The speculum is removed. Attention is turned to the abdomen.  A small infraumbilical incision is made and a 10/11 mm disposable trocar sleeve is placed on the first attempt without difficulty. Placement is verified with the laparoscope and the above findings are noted. The pneumoperitoneum is induced.  A small suprapubic incision is made and a 5 mm nondisposable trocar sleeve is placed under direct visualization without difficulty.  Small incision to the peritoneum is made and hydrodissection is used to elevate the peritoneum from the right pelvic side wall.  The peritoneum containing the implant of endometriosis is then resected.  Minimal bipolar cautery at the peritoneal edges is used to obtain complete hemostasis.  The implant of endometriosis in the posterior cul-de-sac is ablated with bipolar cautery.  The  anterior fundal, subserosal leiomyoma is ablated with bipolar  cautery. Interceed is backloaded through the laparoscope and placed on the right pelvic side wall s well as the posterior cul-de-sac.  Excess fluid is removed.  Inferior trocar sleeve is removed.  The pneumoperitoneum is reduced and no bleeding is noted. Pneumoperitoneum is completely reduced, the umbilical trocar sleeve is removed, and the skin incisions are closed with subcuticular 3-0 Vicryl suture.  The skin incisions are then injected with 0.5% plain Marcaine.  Dressings are applied. Hulka tenaculum is removed and no bleeding is noted.  Sponge, needle, and instrument counts were correct.  The patient is awakened and taken to the recovery room in stable condition. DD:  06/18/99 TD:  06/18/99 Job: [id number]

## 2010-10-23 NOTE — Discharge Summary (Signed)
NAME:  Rose Rodriguez, Rose Rodriguez                           ACCOUNT NO.:  [id number]   MEDICAL RECORD NO.:  [id number]                   PATIENT TYPE:  INP   LOCATION:  9308                                 FACILITY:  [facility]   PHYSICIAN:  Charles A. Sydnee Cabal, MD            DATE OF BIRTH:  1971/01/02   DATE OF ADMISSION:  01/28/2004  DATE OF DISCHARGE:                                 DISCHARGE SUMMARY   PRIMARY DISCHARGE DIAGNOSIS:  Severe pelvic adhesive disease.   PREOPERATIVE DIAGNOSES:  1. Pelvic pain, unclear etiology; probable endometriosis, recurrent, with     history of endometriosis.  2. Dysfunctional uterine bleeding, questionable uterine fibroids on     ultrasound.   PROCEDURE:  Transabdominal hysterectomy, extensive lysis of adhesions.   DISPOSITION:  The patient discharged home to follow up in the office in 72  hours to discontinue staples.  To follow up with Dr. Talmage Nap, endocrinologist,  for management of her diabetes and hypertension.  She was given instructions  to call for temperature greater than 100 degrees, notify of any increasing  pain or vaginal bleeding or purulent discharge or incisional drainage.  No  driving for 2 weeks, no lifting greater than 25 pounds for 4 weeks.  She is  to return to the clinic to discontinue staples in 72 hours.  A prescription  for Percocet one to two p.o. q.4h. p.r.n. #40 5/325 dose and Chromagen Forte  one p.o. daily #40 are given.  She is to restart Amaryl once she is taking  good p.o. at 4 mg dose and then to titrate Lantus up to 20 units per Dr.  Derenda Mis, [facility], who has followed the patient while in-  house.  There were preadmission medications, Lantus being slightly less than  she had been on preadmission.   LABORATORY DATA:  Postoperative hemoglobin 10.6, hematocrit 30.5.  Hemoglobin A1c was 5.3.  Lipid profile:  Cholesterol was 149, HDL was 44,  LDL was 93, triglycerides 59.  Pathology is pending at the time of this  dictation.   HISTORY AND PHYSICAL:  As dictated on the chart.   HOSPITAL COURSE:  The patient was admitted and underwent surgery as noted  above.  Postoperatively, the patient had good pain control with PCA.  Consultation was requested and undertaken with internal medicine for  management of diabetes.  She was initially put on Glucomander and upon  consultation was changed over to a sliding scale insulin.  Postoperative day  #1 the patient was continued on a sliding scale insulin.  CBG was 131 in the  morning labs.  Hemoglobin and hematocrit returned as noted above.  She had  Foley catheter discontinued, voided without difficulty.  Pain medication was  switched over to oral pain medicine and she was given clear liquids as  nausea resolved.  Postoperative day #2 she had a small amount of flatus, was  given some solids, cracker and  toast as tolerated and desired.  Sliding  scale was continued.  She ambulated, had better flatus.  Postoperative day  #3 she was given general diet.  She declined p.o. Amaryl at that time,  pending later tolerating diet.  Internal medicine saw the patient and  agreed, instructed the patient when she went home to start the Amaryl and  discontinued sliding scale  insulin.  She was instructed to titrate up her Lantus insulin at home  pending tolerating a general diet postoperative day #3 and discharge.  At  the time of dictation, she is discharged pending tolerating general diet  today, with follow-up as noted above.                                               Charles A. Sydnee Cabal, MD    CAD/MEDQ  D:  01/31/2004  T:  01/31/2004  Job:  [id number]

## 2010-10-23 NOTE — H&P (Signed)
NAME:  Rose Rodriguez, BREED                           ACCOUNT NO.:  [id number]   MEDICAL RECORD NO.:  [id number]                   PATIENT TYPE:  INP   LOCATION:  NA                                   FACILITY:  [facility]   PHYSICIAN:  Charles A. Sydnee Cabal, MD            DATE OF BIRTH:  02/27/1971   DATE OF ADMISSION:  DATE OF DISCHARGE:                                HISTORY & PHYSICAL   CHIEF COMPLAINT:  Menorrhagia, pelvic pain, history of endometriosis,  uterine fibroids and dysfunctional bleeding.  Multiple complaints, as noted.   HISTORY OF PRESENT ILLNESS:  A 40 year old para 2-0-0-2 with irregular  bleeding and moderate to severe dysmenorrhea.  Symptoms consistent with past  diagnosis of endometriosis as she has heavy bleeding, clots, and known two  uterine fibroids, none that are very large but are multiple in the 2 to 3 cm  range.  Pap is negative May 2005.  Sonohistogram showed normal endometrial  cavity, no evidence of polyp.  Depo-Provera was tried.  She had cold chills  and hot flashes.  She was treated for possible endometritis.  GC and  chlamydia cultures were negative.  Doxycycline was done. This did not help  bleeding or pain.  Ponstel was tried.  This did not help pain.  Vicodin was  used.  This did not help pain adequately.  She was to use Lupron but did not  do so secondary to lack of insurance benefits in this regard and expense.  She tried Pharmacist, hospital.  This did not work.  With all of these problems, she is  now admitted to undergo definitive therapy with transabdominal hysterectomy.  She wishes to undergo oophorectomy if any signs of endometriosis are found.   MEDICATIONS:  1. Amaryl 4 mg once a day.  2. Lantus 30 mL q.h.s.  3. Avapro 300 mg per day.  4. Hydrochlorothiazide 25 mg per day.  5. Cymbalta 60 mg per day.   ALLERGIES:  ASPIRIN with reaction not specified.   PAST MEDICAL HISTORY:  1. Hypertension.  2. Diabetes.  3. GERD.  4. Asthma.  5. Endometriosis.  6.  Fibroids.   PAST SURGICAL HISTORY:  1. Breast reduction bilaterally.  2. C. section x1.  3. SVD x1.  4. Laparoscopy for endometriosis treatment x1.  5. Bilateral tubal ligation.   SOCIAL HISTORY:  Denies tobacco, ethanol or drug use or STD exposure in the  past.  The patient is married and in a monogamous relationship with her  husband.   FAMILY HISTORY:  Lung cancer in her mother.  Diabetes in her mother.  Otherwise two siblings alive and well.  Mother passed away of lung cancer at  age 42.  Father is alive and well at 10.   REVIEW OF SYMPTOMS:  No vision changes or headaches.  Pelvic pain, namely  with dysmenorrhea.  Some left lower quadrant pain occasionally.  No fever or  chills.  No rashes or lesions, headaches or dizziness.  She does have  seasonal allergies occasionally.  No chest pain, shortness of breath or  wheezing, diarrhea, constipation, bleeding, melena, hematochezia, or cyclic  rectal bleeding.  No urgency, frequency, dysuria, incontinence or hematuria.  No galactorrhea current.  No emotional changes.   PHYSICAL EXAMINATION:  GENERAL APPEARANCE:  Alert and oriented x3 in no  distress.  VITAL SIGNS:  Blood pressure 140/82, heart rate 96, weight 190 pounds,  respiratory rate 16.  HEENT:  Grossly within normal limits.  NECK:  Supple without thyromegaly or adenopathy.  LUNGS:  Clear bilaterally.  SKIN:  No rashes are noted.  CARDIOVASCULAR:  Regular rate and rhythm without murmurs, rubs, or gallops.  BREASTS:  No masses, tenderness, discharge, skin or nipple changes  bilaterally.  ABDOMEN:  Soft, flat and nontender.  Mild obesity is noted.  No  hepatosplenomegaly or other masses noted.  No hernias noted.  LYMPH NODES:  No supraclavicular, infraclavicular, groin, axillary,  cervical, post auricular or submandibular nodes are palpable.  PELVIC:  Normal female external genitalia, Bartholin's, urethra and Skene's  within normal limits.  Vault without discharge or  lesions.  Multiparous  cervix high in the vault.  Bimanual examination shows no masses palpable.  Ovaries not palpable secondary to patient's body habitus.  Examined  nontender.  RECTAL:  Examination is not done.  Annus and perineal body appear normal.   ASSESSMENT:  1. Dysfunctional bleeding.  2. Pelvic pain.  3. Endometriosis.  4. Uterine fibroids.   PLAN:  Transabdominal hysterectomy, possible bilateral salpingo-oophorectomy  if endometriosis is found.  The patient accepts the risks of infection,  bleeding, bowel or bladder damage, ureteral damage, blood product risks  including hepatitis and HIV exposure, wound infection, DVT, pulmonary  embolus, anesthesia risks, myocardial infarction, menopausal risks in case  oophorectomy is done and possible having to return to the OR at some future  date if ovaries remain in situ for problem, this rate even approaching 10 to  15%.  All questions were answered and she wishes to proceed with  conservative therapy if ovaries appear normal and the pelvis is normal and  no endometriosis is found.  However, if endometriosis is found, she wishes  to proceed with bilateral oophorectomy.  She will remain NPO past midnight.  She will withhold Lantus and Amaryl.  She will go on and take her Avapro and  hydrochlorothiazide.  She will take Cymbalta if she wishes.  We will obtain  a CBC, CMET and go ahead and get an EKG as she has both diabetes and  hypertension.  Preoperative cefotetan,  __________ on to OR even in light of  her past tubal ligation we will go ahead and get a serum pregnancy test.  She is understanding of this.  All questions are answered and we will  proceed as outlined.                                              Charles A. Sydnee Cabal, MD   CAD/MEDQ  D:  01/22/2004  T:  01/22/2004  Job:  [id number]

## 2010-10-23 NOTE — Op Note (Signed)
[facility]  Patient:    MELVINA, PANGELINAN Visit Number: [id number] MRN: [id number]          Service Type: Attending:  Katy Fitch, M.D. Dictated by:   Katy Fitch, M.D. Proc. Date: 02/10/01                             Operative Report  PREOPERATIVE DIAGNOSES:       1. Intrauterine pregnancy at 37 weeks.                               2. Class B diabetes.                               3. Chronic hypertension.                               4. Severe variables.                               5. Arrest of dilatation.                               6. Multiparity, desiring sterility.  POSTOPERATIVE DIAGNOSES:      1. Intrauterine pregnancy at 37 weeks.                               2. Class B diabetes.                               3. Chronic hypertension.                               4. Severe variables.                               5. Arrest of dilatation.                               6. Multiparity, desiring sterility.  PROCEDURE:                    Primary low transverse cesarean section.  SURGEON:                      Katy Fitch, M.D.  ANESTHESIA:                   Epidural.  ESTIMATED BLOOD LOSS:         600.  URINE OUTPUT:                 200.  FLUIDS:                       1600 crystalloid.  COMPLICATIONS:                None.  SPECIMENS:  Cord blood.  Cord pH.  Placenta.  FINDINGS:                     Living 7 lb 5 oz female with Apgars of 2 and 9 with a cord pH of 7.34 with three-vessel cord.  The cord was also noted to have a nuchal cord and wrapped around the fetal arm and fetal face.  There was normal-appearing uterus, tubes and ovaries.  DESCRIPTION OF PROCEDURE:     The patient was taken to the operating room, where epidural anesthesia was found to be adequate.  The patient was prepped and draped in the normal sterile fashion.  A Pfannenstiel incision was made with a scalpel and carried down to the  underlying fascia.  The fascia was incised and extended transversely using curved Mayo scissors.  The underlying rectus muscles were dissected off both bluntly and sharply after the fascia was grasped with Kocher clamps.  The rectus muscles were separated in the midline manually.  The underlying peritoneum was entered bluntly with the fingers.  A bladder blade was placed in the pelvis.  The vesicouterine peritoneum was identified and incised with Metzenbaum scissors.  It was extended transversely and a bladder flap was created digitally.  The bladder blade was reinserted in the newly-created bladder flap.  A low transverse uterine incision was made with a scalpel and extended manually.  The infants head was then delivered.  The cord was noted to be around the face initially. Once delivered, the nuchal cord was reduced and also taken off around the arm. The mouth and nose were suctioned.  The cord was clamped.  Cord pH was obtained. Cord blood was then obtained.  The placenta was massaged from the uterus.  The uterus was then exteriorized and clear of all clots and debris. A running 1-0 chromic interlocking stitch was used to close the lower uterine segment.  A second stitch was then used to imbricate the first layer for hemostasis.  The right tube was then grasped with a Babcock clamp, doubly ligated with 0 plain suture and excised with Metzenbaum scissors.  This was then done similarly to the left.  The posterior cul-de-sac was clear of all clots and debris with warm normal saline.  The uterus was returned to the abdominal cavity.  The tubes were then reinspected for hemostasis, which was noted.  The lower uterine incision was then irrigated.  There was one area of bleeding noted and a figure-of-eight suture was placed.  Hemostasis was then noted after one more irrigation.  The fascia was then reapproximated using 0 PDS in a running continuous fashion.  The subcutaneous tissue was  then irrigated and the skin was closed using staples.  The patient was taken to the recovery room in stable condition. Dictated by:   Katy Fitch, M.D. Attending:  Katy Fitch, M.D. DD:  02/10/01 TD:  02/11/01 Job: [id number]

## 2010-10-23 NOTE — Discharge Summary (Signed)
Rose Rodriguez, Rose Rodriguez                 ACCOUNT NO.:  [id number]   MEDICAL RECORD NO.:  [id number]          PATIENT TYPE:  INP   LOCATION:  6739                         FACILITY:  [facility]   PHYSICIAN:  Sean A. Everardo All, MD    DATE OF BIRTH:  1971/04/13   DATE OF ADMISSION:  03/25/2006  DATE OF DISCHARGE:  03/27/2006                                 DISCHARGE SUMMARY   REASON FOR ADMISSION:  Diabetes with hyperosmolar state.   HISTORY OF PRESENT ILLNESS:  This is a 40 year old woman admitted March 25, 2006 with diabetes and hyperosmolar state.  She appeared at home to have  consistent hyperglycemia despite over 400 units of insulin a day.  Please  refer to my dictated history and physical for details.   HOSPITAL COURSE:  She was managed with the Glucommander (computer-driven  computer-determined intravenous regular insulin with frequent CBGs).  As  best I can determined, her intravenous requirement was somewhere between 39  and 150 units a day.   For her hypokalemia, she was repleted with K-Dur and her cramps felt better.   By March 27, 2006, the patient still had a headache, but was feeling  better in general and was, thus, discharged home in fair condition.   DIAGNOSES AT THE TIME OF ADMISSION:  1. Mild hypokalemia.  2. Muscle cramps caused by or exacerbated by number one.  3. Otherwise same as on my admission history and physical.   MEDICATIONS:  1. She is administered a single dose of Lantus 200 units prior to      discharge this morning.  2. She is advised otherwise to not take any additional insulin.  3. K-Dur 20 mEq daily.  4. Otherwise same as prior to admission.   DIET:  Unlimited calorie, diabetic.   ACTIVITY:  No restriction.   FOLLOWUP:  Followup will be with me tomorrow morning at 8 o'clock.           ______________________________  Cleophas Dunker. Everardo All, MD     SAE/MEDQ  D:  03/27/2006  T:  03/27/2006  Job:  [id number]

## 2018-02-02 ENCOUNTER — Encounter: Payer: Self-pay | Admitting: Emergency Medicine

## 2018-02-02 ENCOUNTER — Other Ambulatory Visit: Payer: Self-pay

## 2018-02-02 ENCOUNTER — Emergency Department (HOSPITAL_COMMUNITY)
Admission: EM | Admit: 2018-02-02 | Discharge: 2018-02-02 | Disposition: A | Discharge status: Home / Self Care | Payer: 59 | Attending: Emergency Medicine | Admitting: Emergency Medicine

## 2018-02-02 DIAGNOSIS — Z8673 Personal history of transient ischemic attack (TIA), and cerebral infarction without residual deficits: Secondary | ICD-10-CM | POA: Insufficient documentation

## 2018-02-02 DIAGNOSIS — E11649 Type 2 diabetes mellitus with hypoglycemia without coma: Secondary | ICD-10-CM | POA: Diagnosis not present

## 2018-02-02 DIAGNOSIS — Z7902 Long term (current) use of antithrombotics/antiplatelets: Secondary | ICD-10-CM | POA: Diagnosis not present

## 2018-02-02 DIAGNOSIS — Z79899 Other long term (current) drug therapy: Secondary | ICD-10-CM | POA: Insufficient documentation

## 2018-02-02 DIAGNOSIS — I120 Hypertensive chronic kidney disease with stage 5 chronic kidney disease or end stage renal disease: Secondary | ICD-10-CM | POA: Insufficient documentation

## 2018-02-02 DIAGNOSIS — F329 Major depressive disorder, single episode, unspecified: Secondary | ICD-10-CM | POA: Diagnosis not present

## 2018-02-02 DIAGNOSIS — E162 Hypoglycemia, unspecified: Secondary | ICD-10-CM

## 2018-02-02 DIAGNOSIS — Z992 Dependence on renal dialysis: Secondary | ICD-10-CM | POA: Diagnosis present

## 2018-02-02 DIAGNOSIS — F419 Anxiety disorder, unspecified: Secondary | ICD-10-CM | POA: Diagnosis not present

## 2018-02-02 DIAGNOSIS — N186 End stage renal disease: Secondary | ICD-10-CM | POA: Diagnosis not present

## 2018-02-02 HISTORY — DX: Disorder of kidney and ureter, unspecified: N28.9

## 2018-02-02 HISTORY — DX: Type 2 diabetes mellitus without complications: E11.9

## 2018-02-02 HISTORY — DX: Transient cerebral ischemic attack, unspecified: G45.9

## 2018-02-02 HISTORY — DX: Unspecified background retinopathy: H35.00

## 2018-02-02 HISTORY — DX: Essential (primary) hypertension: I10

## 2018-02-02 HISTORY — DX: Anxiety disorder, unspecified: F41.9

## 2018-02-02 HISTORY — DX: Cardiac arrest, cause unspecified: I46.9

## 2018-02-02 LAB — CBC WITH DIFFERENTIAL/PLATELET
Basophils Absolute: 0 10*3/uL (ref 0.0–0.1)
Basophils Relative: 0 %
Eosinophils Absolute: 0.2 10*3/uL (ref 0.0–0.7)
Eosinophils Relative: 2 %
HCT: 29.2 % — ABNORMAL LOW (ref 36.0–46.0)
Hemoglobin: 9.3 g/dL — ABNORMAL LOW (ref 12.0–15.0)
Lymphocytes Relative: 33 %
Lymphs Abs: 4.3 10*3/uL — ABNORMAL HIGH (ref 0.7–4.0)
MCH: 27.2 pg (ref 26.0–34.0)
MCHC: 31.8 g/dL (ref 30.0–36.0)
MCV: 85.4 fL (ref 78.0–100.0)
Monocytes Absolute: 1.1 10*3/uL — ABNORMAL HIGH (ref 0.1–1.0)
Monocytes Relative: 9 %
Neutro Abs: 7.3 10*3/uL (ref 1.7–7.7)
Neutrophils Relative %: 56 %
Platelets: 297 10*3/uL (ref 150–400)
RBC: 3.42 MIL/uL — ABNORMAL LOW (ref 3.87–5.11)
RDW: 17.8 % — ABNORMAL HIGH (ref 11.5–15.5)
WBC: 13 10*3/uL — ABNORMAL HIGH (ref 4.0–10.5)

## 2018-02-02 LAB — BASIC METABOLIC PANEL
Anion gap: 16 — ABNORMAL HIGH (ref 5–15)
BUN: 14 mg/dL (ref 6–20)
CO2: 22 mmol/L (ref 22–32)
Calcium: 8.2 mg/dL — ABNORMAL LOW (ref 8.9–10.3)
Chloride: 103 mmol/L (ref 98–111)
Creatinine, Ser: 5.51 mg/dL — ABNORMAL HIGH (ref 0.44–1.00)
GFR calc Af Amer: 10 mL/min — ABNORMAL LOW (ref >60)
GFR calc non Af Amer: 8 mL/min — ABNORMAL LOW (ref >60)
Glucose, Bld: 56 mg/dL — ABNORMAL LOW (ref 70–99)
Potassium: 3.7 mmol/L (ref 3.5–5.1)
Sodium: 141 mmol/L (ref 135–145)

## 2018-02-02 LAB — CBG MONITORING, ED: Glucose-Capillary: 76 mg/dL (ref 70–99)

## 2018-02-02 NOTE — Discharge Instructions (Addendum)
[place] have [place] and orders for your dialysis transferred to the dialysis center here, listed above, it this is not possible, you may need to go to [place]. Contact them today so your dialysis can resume here as soon as possible. In the interim, return here if you develop any worsening fluid retention, weakness or any new complaints. Your labs are stable today, so you do not need dialysis today, but this may change within the next few days.

## 2018-02-02 NOTE — ED Notes (Signed)
Patient states she wants to be referred to a specialty doctor. I told her to speak with the provider regarding this issue.

## 2018-02-02 NOTE — ED Provider Notes (Signed)
[facility] EMERGENCY DEPARTMENT Provider Note   CSN: [id number] Arrival date & time: 02/02/18  1038     History   Chief Complaint Chief Complaint  Patient presents with  . Needs Dialysis    HPI Rose Rodriguez is a 47 y.o. female with DM, HTN and ESRD who recently [place] but moved here to be near family.  Her last dialysis was completed 2 days ago and she presents today for her routine Thursday dialysis.  She plans to establish medical care here and requests referrals.  She denies increased shortness of breath, orthopnea, chest pain, weakness, n/v, abdominal pain or fevers. Her DM is currently diet controlled as she states she used to be on insulin but it dropped her cbg's too low. Reports peripheral neuropathy and has healing ulcers on her feet. [place] in [place].  HPI  Past Medical History:  Diagnosis Date  . Anxiety   . Cardiac arrest (HCC)   . Diabetes mellitus without complication (HCC)    Type I  . Hypertension   . Renal disorder   . Retinopathy   . TIA (transient ischemic attack)     Patient Active Problem List   Diagnosis Date Noted  . DIABETES MELLITUS, TYPE I 12/28/2006  . DEPRESSION 12/28/2006  . HYPERTENSION 12/28/2006  . GERD 12/28/2006    Past Surgical History:  Procedure Laterality Date  . ABDOMINAL HYSTERECTOMY    . AV FISTULA INSERTION W/ RF MAGNETIC GUIDANCE    . port       OB History   None      Home Medications    Prior to Admission medications   Medication Sig Start Date End Date Taking? Authorizing Provider  bumetanide (BUMEX) 2 MG tablet Take 2 mg by mouth as needed (for swelling).   Yes [provider]  carvedilol (COREG) 25 MG tablet Take 25 mg by mouth 2 (two) times daily with a meal. Stop taking metoprolol   Yes [provider]  cloNIDine (CATAPRES) 0.1 MG tablet Take 0.1 mg by mouth 2 (two) times daily. 11/18/17  Yes [provider]    clopidogrel (PLAVIX) 75 MG tablet Take 75 mg by mouth daily.   Yes [provider]  Continuous Blood Gluc Sensor (FREESTYLE LIBRE 14 DAY SENSOR) MISC CHANGE SENSOR EVERY 14 DAYS 11/19/17  Yes [provider]  erythromycin ethylsuccinate (EES) 200 MG/5ML suspension Take 5 mLs by mouth 2 (two) times daily. 11/15/17  Yes [provider]  ketoconazole (NIZORAL) 2 % shampoo APPLY TO AFFECTED AREA(S). LATHER LEAVE IN PLACE FOR 5 MINUTES THEN RINSE OFF WITH WATER ONCE DAILY 12/06/17  Yes [provider]  lisinopril (PRINIVIL,ZESTRIL) 20 MG tablet Take 20 mg by mouth daily. 01/27/18  Yes [provider]  LORazepam (ATIVAN) 1 MG tablet Take 1 mg by mouth daily as needed. 12/22/17  Yes [provider]  NIFEdipine (PROCARDIA XL/ADALAT-CC) 60 MG 24 hr tablet Take 60 mg by mouth daily. 01/19/18  Yes [provider]  promethazine (PHENERGAN) 25 MG tablet Take 25 mg by mouth every 8 (eight) hours as needed for nausea or vomiting.   Yes [provider]  ranitidine (ZANTAC) 150 MG/10ML syrup Take 150 mg by mouth 2 (two) times daily.   Yes [provider]  sodium bicarbonate 650 MG tablet Take 650 mg by mouth 2 (two) times daily.   Yes [provider]  zonisamide (ZONEGRAN) 25 MG capsule Take 25  mg by mouth 2 (two) times daily.   Yes [provider]  amoxicillin-clavulanate (AUGMENTIN) 875-125 MG tablet TAKE 1 TABLET BY MOUTH ONCE DAILY AFTER A MEAL FOR 14 DAYS 12/14/17   [provider]  clobetasol (TEMOVATE) 0.05 % external solution APPLY SOLUTION TOPICALLY TWICE DAILY FOR 2 WEEKS THEN TAPER TO TWICE DAILY ON WEEKENDS ONLY 01/27/18   [provider]  oxyCODONE-acetaminophen (PERCOCET/ROXICET) 5-325 MG tablet TAKE 1 TO 2 TABLETS BY MOUTH EVERY 4 TO 6 HOURS AS NEEDED FOR PAIN FOR 2 DAYS 01/25/18   [provider]    Family History History reviewed. No pertinent family history.  Social  History Social History   Tobacco Use  . Smoking status: Never Smoker  . Smokeless tobacco: Never Used  Substance Use Topics  . Alcohol use: Not Currently  . Drug use: Not Currently     Allergies   Aspirin and Reglan [metoclopramide]   Review of Systems Review of Systems  Constitutional: Negative for fever.  HENT: Negative for congestion and sore throat.   Eyes: Negative.   Respiratory: Negative for cough, chest tightness, shortness of breath and wheezing.   Cardiovascular: Negative for chest pain.  Gastrointestinal: Negative for abdominal pain and nausea.  Genitourinary: Negative.   Musculoskeletal: Negative for arthralgias, joint swelling and neck pain.  Skin: Positive for wound. Negative for rash.  Neurological: Negative for dizziness, weakness, light-headedness, numbness and headaches.  Psychiatric/Behavioral: Negative.      Physical Exam Updated Vital Signs BP (!) 172/87   Pulse (!) 103   Temp 97.7 F (36.5 C) (Oral)   Resp 13   Ht 5\' 2"  (1.575 m)   Wt 57.6 kg   SpO2 100%   BMI 23.23 kg/m   Physical Exam  Constitutional: She appears well-developed and well-nourished.  HENT:  Head: Normocephalic and atraumatic.  Eyes: Conjunctivae are normal.  Neck: Normal range of motion.  Cardiovascular: Regular rhythm, normal heart sounds and intact distal pulses.  Borderline tachy.  Pulmonary/Chest: Effort normal and breath sounds normal. She has no wheezes. She has no rales.  Port right chest wall. Per pt report, was changed 2 days ago with her last dialysis.  Abdominal: Soft. Bowel sounds are normal. She exhibits no distension. There is no tenderness. There is no guarding.  Musculoskeletal: Normal range of motion.  Neurological: She is alert.  Skin: Skin is warm and dry.  Superficial skin peeling, healing ulceration with good granulation tissue right plantar foot over metatarsal heads.  No signs of infection.  Scabs present left distal 2nd and third toes and at mtp.   No drainage, no surrounding erythema.    Psychiatric: She has a normal mood and affect.  Nursing note and vitals reviewed.    ED Treatments / Results  Labs (all labs ordered are listed, but only abnormal results are displayed) Labs Reviewed  BASIC METABOLIC PANEL - Abnormal; Notable for the following components:      Result Value   Glucose, Bld 56 (*)    Creatinine, Ser 5.51 (*)    Calcium 8.2 (*)    GFR calc non Af Amer 8 (*)    GFR calc Af Amer 10 (*)    Anion gap 16 (*)    All other components within normal limits  CBC WITH DIFFERENTIAL/PLATELET - Abnormal; Notable for the following components:   WBC 13.0 (*)    RBC 3.42 (*)    Hemoglobin 9.3 (*)    HCT 29.2 (*)    RDW 17.8 (*)  Lymphs Abs 4.3 (*)    Monocytes Absolute 1.1 (*)    All other components within normal limits  CBG MONITORING, ED    EKG None  Radiology No results found.  Procedures Procedures (including critical care time)  Medications Ordered in ED Medications - No data to display   Initial Impression / Assessment and Plan / ED Course  I have reviewed the triage vital signs and the nursing notes.  Pertinent labs & imaging results that were available during my care of the patient were reviewed by me and considered in my medical decision making (see chart for details).     Labs reviewed, stable except for hypoglycemia. Pt was given snack and drink.  Repeat cbg stable.  Discussed pt with Dr. Fausto Skillern who reviewed labs, pt stable and does not need dialysis today.  Advised she have her dialysis center contact [facility] here to arrange transfer of care.  Pt states she already did this and the dialysis center (on [facility]) stated she needed to come to the ed.   Call placed to [facility] [address]who stated pt tried to get care transferred and because she is new to dialysis and left abruptly from their tx center,  Her renal doctor (Dr. Carmie Kanner) in [place] was unwilling to write a letter of  stability which was requested of the dialysis center, so they could not accept her as a pt.  Call to Dr. Fausto Skillern again to discuss this new information.  He concurred that pt would need to contact her kidney center at home to either arrange ongoing care, transfer of care to here.  She does not need dialysis today.  Advised pt of this.  Return precautions discussed.     Final Clinical Impressions(s) / ED Diagnoses   Final diagnoses:  Dialysis patient [facility])  Hypoglycemia    ED Discharge Orders    None       Victoriano Lain 02/02/18 1400    Bethann Berkshire, MD 02/02/18 1504

## 2018-02-02 NOTE — ED Notes (Signed)
Juice and crackers offered to patient.  Patient sitting up in bed eating.  Call light in reach.

## 2018-02-02 NOTE — ED Triage Notes (Signed)
Pt reports [facility] [place]. Pt unsure of if she is going to stay in town or go back home. Last tx was Tuesday for full tx time. Pt has newly implanted fistula L arm and a port in R chest. Started dialysis 3-4 weeks ago.

## 2020-02-06 DEATH — deceased
